# Patient Record
Sex: Female | Born: 1955
Health system: Southern US, Community
[De-identification: ages and names within clinical notes are randomized; demographics above are authoritative.]

## PROBLEM LIST (undated history)

## (undated) DIAGNOSIS — Q828 Other specified congenital malformations of skin: Secondary | ICD-10-CM

## (undated) DIAGNOSIS — Q66229 Congenital metatarsus adductus, unspecified foot: Secondary | ICD-10-CM

## (undated) DIAGNOSIS — M1990 Unspecified osteoarthritis, unspecified site: Secondary | ICD-10-CM

## (undated) DIAGNOSIS — M2010 Hallux valgus (acquired), unspecified foot: Secondary | ICD-10-CM

## (undated) DIAGNOSIS — F419 Anxiety disorder, unspecified: Secondary | ICD-10-CM

## (undated) DIAGNOSIS — R011 Cardiac murmur, unspecified: Secondary | ICD-10-CM

## (undated) DIAGNOSIS — I739 Peripheral vascular disease, unspecified: Secondary | ICD-10-CM

## (undated) DIAGNOSIS — E785 Hyperlipidemia, unspecified: Secondary | ICD-10-CM

## (undated) DIAGNOSIS — M19071 Primary osteoarthritis, right ankle and foot: Secondary | ICD-10-CM

## (undated) DIAGNOSIS — K219 Gastro-esophageal reflux disease without esophagitis: Secondary | ICD-10-CM

## (undated) DIAGNOSIS — M810 Age-related osteoporosis without current pathological fracture: Secondary | ICD-10-CM

## (undated) DIAGNOSIS — Z860100 Personal history of colon polyps, unspecified: Secondary | ICD-10-CM

## (undated) DIAGNOSIS — M2042 Other hammer toe(s) (acquired), left foot: Secondary | ICD-10-CM

## (undated) DIAGNOSIS — G250 Essential tremor: Secondary | ICD-10-CM

## (undated) DIAGNOSIS — F32A Depression, unspecified: Secondary | ICD-10-CM

## (undated) DIAGNOSIS — M2041 Other hammer toe(s) (acquired), right foot: Secondary | ICD-10-CM

## (undated) DIAGNOSIS — C801 Malignant (primary) neoplasm, unspecified: Secondary | ICD-10-CM

## (undated) DIAGNOSIS — M19079 Primary osteoarthritis, unspecified ankle and foot: Secondary | ICD-10-CM

## (undated) DIAGNOSIS — I10 Essential (primary) hypertension: Secondary | ICD-10-CM

## (undated) DIAGNOSIS — F329 Major depressive disorder, single episode, unspecified: Secondary | ICD-10-CM

## (undated) DIAGNOSIS — G4700 Insomnia, unspecified: Secondary | ICD-10-CM

## (undated) DIAGNOSIS — R251 Tremor, unspecified: Secondary | ICD-10-CM

## (undated) DIAGNOSIS — S5290XA Unspecified fracture of unspecified forearm, initial encounter for closed fracture: Secondary | ICD-10-CM

## (undated) HISTORY — DX: Tremor, unspecified: R25.1

## (undated) HISTORY — DX: Personal history of colon polyps, unspecified: Z86.0100

## (undated) HISTORY — DX: Hyperlipidemia, unspecified: E78.5

## (undated) HISTORY — DX: Malignant (primary) neoplasm, unspecified: C80.1

## (undated) HISTORY — DX: Depression, unspecified: F32.A

## (undated) HISTORY — DX: Essential tremor: G25.0

## (undated) HISTORY — DX: Peripheral vascular disease, unspecified: I73.9

## (undated) HISTORY — DX: Anxiety disorder, unspecified: F41.9

## (undated) HISTORY — DX: Congenital metatarsus adductus, unspecified foot: Q66.229

## (undated) HISTORY — DX: Primary osteoarthritis, unspecified ankle and foot: M19.079

## (undated) HISTORY — DX: Hallux valgus (acquired), unspecified foot: M20.10

## (undated) HISTORY — PX: BUNIONECTOMY: SHX129

## (undated) HISTORY — DX: Age-related osteoporosis without current pathological fracture: M81.0

## (undated) HISTORY — PX: BREAST CYST EXCISION: SHX579

## (undated) HISTORY — DX: Gastro-esophageal reflux disease without esophagitis: K21.9

## (undated) HISTORY — DX: Essential (primary) hypertension: I10

## (undated) HISTORY — DX: Other hammer toe(s) (acquired), right foot: M20.41

## (undated) HISTORY — DX: Primary osteoarthritis, right ankle and foot: M19.071

## (undated) HISTORY — DX: Other specified congenital malformations of skin: Q82.8

## (undated) HISTORY — DX: Major depressive disorder, single episode, unspecified: F32.9

## (undated) HISTORY — DX: Personal history of colonic polyps: Z86.010

## (undated) HISTORY — PX: BREAST SURGERY: SHX581

## (undated) HISTORY — DX: Other hammer toe(s) (acquired), left foot: M20.42

## (undated) HISTORY — PX: CARDIAC CATHETERIZATION: SHX172

---

## 1999-11-19 ENCOUNTER — Ambulatory Visit (HOSPITAL_COMMUNITY): Admission: RE | Admit: 1999-11-19 | Discharge: 1999-11-19 | Payer: Self-pay | Admitting: Gastroenterology

## 1999-12-01 ENCOUNTER — Other Ambulatory Visit: Admission: RE | Admit: 1999-12-01 | Discharge: 1999-12-01 | Payer: Self-pay | Admitting: *Deleted

## 2000-04-21 ENCOUNTER — Encounter: Payer: Self-pay | Admitting: Internal Medicine

## 2000-04-21 ENCOUNTER — Encounter: Admission: RE | Admit: 2000-04-21 | Discharge: 2000-04-21 | Payer: Self-pay | Admitting: Internal Medicine

## 2000-12-15 ENCOUNTER — Other Ambulatory Visit: Admission: RE | Admit: 2000-12-15 | Discharge: 2000-12-15 | Payer: Self-pay | Admitting: Internal Medicine

## 2001-01-09 ENCOUNTER — Other Ambulatory Visit: Admission: RE | Admit: 2001-01-09 | Discharge: 2001-01-09 | Payer: Self-pay | Admitting: *Deleted

## 2001-03-01 ENCOUNTER — Ambulatory Visit (HOSPITAL_BASED_OUTPATIENT_CLINIC_OR_DEPARTMENT_OTHER): Admission: RE | Admit: 2001-03-01 | Discharge: 2001-03-01 | Payer: Self-pay | Admitting: General Surgery

## 2001-03-01 ENCOUNTER — Encounter (INDEPENDENT_AMBULATORY_CARE_PROVIDER_SITE_OTHER): Payer: Self-pay | Admitting: *Deleted

## 2001-05-03 ENCOUNTER — Encounter: Payer: Self-pay | Admitting: Internal Medicine

## 2001-05-03 ENCOUNTER — Encounter: Admission: RE | Admit: 2001-05-03 | Discharge: 2001-05-03 | Payer: Self-pay | Admitting: Internal Medicine

## 2001-09-28 ENCOUNTER — Encounter: Admission: RE | Admit: 2001-09-28 | Discharge: 2001-09-28 | Payer: Self-pay | Admitting: *Deleted

## 2001-09-28 ENCOUNTER — Encounter: Payer: Self-pay | Admitting: *Deleted

## 2001-10-29 ENCOUNTER — Encounter: Payer: Self-pay | Admitting: Orthopedic Surgery

## 2001-10-29 ENCOUNTER — Ambulatory Visit (HOSPITAL_COMMUNITY): Admission: RE | Admit: 2001-10-29 | Discharge: 2001-10-29 | Payer: Self-pay | Admitting: Orthopedic Surgery

## 2002-01-02 ENCOUNTER — Other Ambulatory Visit: Admission: RE | Admit: 2002-01-02 | Discharge: 2002-01-02 | Payer: Self-pay | Admitting: Internal Medicine

## 2002-01-04 ENCOUNTER — Encounter: Payer: Self-pay | Admitting: Internal Medicine

## 2002-01-04 ENCOUNTER — Encounter: Admission: RE | Admit: 2002-01-04 | Discharge: 2002-01-04 | Payer: Self-pay | Admitting: Internal Medicine

## 2002-05-21 ENCOUNTER — Encounter: Payer: Self-pay | Admitting: Internal Medicine

## 2002-05-21 ENCOUNTER — Encounter: Admission: RE | Admit: 2002-05-21 | Discharge: 2002-05-21 | Payer: Self-pay | Admitting: Internal Medicine

## 2003-01-31 ENCOUNTER — Other Ambulatory Visit: Admission: RE | Admit: 2003-01-31 | Discharge: 2003-01-31 | Payer: Self-pay | Admitting: Internal Medicine

## 2003-10-09 ENCOUNTER — Encounter: Admission: RE | Admit: 2003-10-09 | Discharge: 2003-10-09 | Payer: Self-pay | Admitting: Internal Medicine

## 2003-10-09 ENCOUNTER — Encounter: Payer: Self-pay | Admitting: Internal Medicine

## 2003-10-09 ENCOUNTER — Ambulatory Visit (HOSPITAL_COMMUNITY): Admission: RE | Admit: 2003-10-09 | Discharge: 2003-10-09 | Payer: Self-pay | Admitting: Internal Medicine

## 2004-01-08 ENCOUNTER — Inpatient Hospital Stay (HOSPITAL_BASED_OUTPATIENT_CLINIC_OR_DEPARTMENT_OTHER): Admission: RE | Admit: 2004-01-08 | Discharge: 2004-01-08 | Payer: Self-pay | Admitting: Interventional Cardiology

## 2004-03-17 ENCOUNTER — Other Ambulatory Visit: Admission: RE | Admit: 2004-03-17 | Discharge: 2004-03-17 | Payer: Self-pay | Admitting: Internal Medicine

## 2004-05-07 ENCOUNTER — Encounter: Admission: RE | Admit: 2004-05-07 | Discharge: 2004-05-07 | Payer: Self-pay | Admitting: Internal Medicine

## 2004-07-13 ENCOUNTER — Encounter: Admission: RE | Admit: 2004-07-13 | Discharge: 2004-07-13 | Payer: Self-pay | Admitting: Internal Medicine

## 2005-06-03 ENCOUNTER — Encounter: Admission: RE | Admit: 2005-06-03 | Discharge: 2005-06-03 | Payer: Self-pay | Admitting: Internal Medicine

## 2005-06-13 ENCOUNTER — Encounter: Admission: RE | Admit: 2005-06-13 | Discharge: 2005-06-13 | Payer: Self-pay | Admitting: Internal Medicine

## 2005-12-19 HISTORY — PX: WRIST ARTHROPLASTY: SHX1088

## 2005-12-19 LAB — HM COLONOSCOPY

## 2006-03-28 ENCOUNTER — Other Ambulatory Visit: Admission: RE | Admit: 2006-03-28 | Discharge: 2006-03-28 | Payer: Self-pay | Admitting: Internal Medicine

## 2006-06-19 ENCOUNTER — Encounter: Admission: RE | Admit: 2006-06-19 | Discharge: 2006-06-19 | Payer: Self-pay | Admitting: Internal Medicine

## 2006-10-24 ENCOUNTER — Encounter: Admission: RE | Admit: 2006-10-24 | Discharge: 2006-10-24 | Payer: Self-pay | Admitting: Otolaryngology

## 2007-06-26 ENCOUNTER — Encounter: Admission: RE | Admit: 2007-06-26 | Discharge: 2007-06-26 | Payer: Self-pay | Admitting: *Deleted

## 2008-06-04 ENCOUNTER — Other Ambulatory Visit: Admission: RE | Admit: 2008-06-04 | Discharge: 2008-06-04 | Payer: Self-pay | Admitting: Family Medicine

## 2008-06-26 ENCOUNTER — Encounter: Admission: RE | Admit: 2008-06-26 | Discharge: 2008-06-26 | Payer: Self-pay | Admitting: Family Medicine

## 2009-06-18 ENCOUNTER — Other Ambulatory Visit: Admission: RE | Admit: 2009-06-18 | Discharge: 2009-06-18 | Payer: Self-pay | Admitting: Family Medicine

## 2009-07-08 ENCOUNTER — Encounter: Admission: RE | Admit: 2009-07-08 | Discharge: 2009-07-08 | Payer: Self-pay | Admitting: Family Medicine

## 2010-07-21 ENCOUNTER — Encounter: Admission: RE | Admit: 2010-07-21 | Discharge: 2010-07-21 | Payer: Self-pay | Admitting: Family Medicine

## 2010-08-25 ENCOUNTER — Encounter: Admission: RE | Admit: 2010-08-25 | Discharge: 2010-08-25 | Payer: Self-pay | Admitting: Family Medicine

## 2010-12-19 LAB — HM DEXA SCAN

## 2011-02-25 ENCOUNTER — Other Ambulatory Visit: Payer: Self-pay | Admitting: Family Medicine

## 2011-02-25 ENCOUNTER — Other Ambulatory Visit (HOSPITAL_COMMUNITY)
Admission: RE | Admit: 2011-02-25 | Discharge: 2011-02-25 | Disposition: A | Discharge status: Home / Self Care | Payer: 59 | Source: Ambulatory Visit | Attending: Family Medicine | Admitting: Family Medicine

## 2011-02-25 DIAGNOSIS — Z124 Encounter for screening for malignant neoplasm of cervix: Secondary | ICD-10-CM | POA: Insufficient documentation

## 2011-05-06 NOTE — Op Note (Signed)
[facility]  Patient:    Audrey Baxter, SCHAUER                   MRN: [id number] Proc. Date: 03/01/01 Adm. Date:  [id number] Attending:  Chevis Pretty S                           Operative Report  PREOPERATIVE DIAGNOSIS:  Infected cyst of back.  POSTOPERATIVE DIAGNOSIS:  Infected cyst of back.  PROCEDURE:  Incision and drainage of infected cyst on the back.  SURGEON:  Chevis Pretty, M.D.  ANESTHESIA:  Local with 1% lidocaine with epinephrine.  DESCRIPTION OF PROCEDURE:  After informed consent was obtained, the patient was brought to the operating room and placed in a prone position on the operating room table.  After prepping the area on the back with Betadine, this area was draped in the usual sterile manner.  The area around the infected cyst was infiltrated with 1% lidocaine with epinephrine, and several minutes were allowed to pass.  An elliptical incision was made over top of this cystic area, and the cyst was removed in its entirety using sharp dissection with a 15 blade knife.  It had appeared that the cyst had ruptured deeply into the muscle tissue, and a pocket was identified and opened with purulent material in it.  Because of this, the wound was left open and packed with gauze. Patient was instructed on dressing changes.  A sterile dressing was then applied on top of this.  The patient tolerated the procedure well.  At the end of the case, all needle, sponge, and instrument counts were correct.  The patient was in stable condition at the end of the case. DD:  03/01/01 TD:  03/01/01 Job: [id number]

## 2011-05-06 NOTE — Cardiovascular Report (Signed)
NAME:  LISEL, SIEGRIST                      ACCOUNT NO.:  [id number]   MEDICAL RECORD NO.:  [id number]                   PATIENT TYPE:  OIB   LOCATION:  6501                                 FACILITY:  [facility]   PHYSICIAN:  Lyn Records III, M.D.            DATE OF BIRTH:  09-04-1956   DATE OF PROCEDURE:  01/08/2004  DATE OF DISCHARGE:  01/08/2004                              CARDIAC CATHETERIZATION   INDICATIONS FOR PROCEDURE:  Abnormal Cardiolite, history of coronary artery  disease, symptoms of exertional dyspnea and other risk factors including  hyperlipidemia and smoking, but currently discontinued. The study is being  done to rule out obstructive coronary disease.   DATE OF PROCEDURE:  January 07, 2003.   PROCEDURE PERFORMED:  1. Left heart catheterization.  2. Selective coronary angiography.  3. Left ventriculography.   DESCRIPTION:  After informed consent, a 4-French sheath was placed in the  right femoral artery using modified Seldinger technique.  A 4-French Judkins  right catheter was used for hemodynamic recordings, left ventriculography by  hand injection, and right coronary angiography.  We then used a #4 4-French  left Judkins catheter for left coronary angiography.  The patient tolerated  the procedure without complications.   RESULTS:   I. HEMODYNAMIC DATA:  A.  Left ventricular pressure 115/10.  B.  Aortic pressure 111/52.   III. CORONARY ANGIOGRAPHY:  A.  Left main coronary:  Short and normal.  B.  Left anterior descending coronary:  The LAD is large.  There does appear  to be a region where the left anterior descending dips into the septum.  This is just distal to the first septal perforator.  Also, in the LAD in  this region there appears to be a 25-30% narrowing.  No high grade  obstruction in the LAD is noted.  The LAD wraps around the left ventricular  apex.  C.  Circumflex artery:  The circumflex coronary artery gives origin to one  large  trifurcating obtuse marginal and the continuation to a smaller second  obtuse marginal.  No significant obstruction is noted in the circumflex.  D.  Right coronary:  The right coronary artery is dominant given the PDA, AV  nodal artery and large RV branch.  No significant abnormalities are noted.   IV. LEFT VENTRICULOGRAPHY:  The left ventricle demonstrates normal overall  contractility with an ejection fraction estimated to be  55%.   CONCLUSIONS:  1. Widely patent coronaries with perhaps a 25% narrowing in what appears to     be a intramyocardial portion of the left anterior descending just distal     to the first septal perforator.  No significant obstructive coronary     lesions are noted.  2. Overall normal left ventricular function and left ventricular pressures.  3. Abnormal Cardiolite, likely represents false-positive response due to     soft tissue attenuation.   RECOMMENDATIONS:  Continue aggressive risk factor modification.  Lesleigh Noe, M.D.    HWS/MEDQ  D:  01/08/2004  T:  01/08/2004  Job:  [id number]   cc:   Darius Bump, M.D.  [place]  [place] [address]  Fax: [phone]

## 2011-07-26 ENCOUNTER — Other Ambulatory Visit: Payer: Self-pay | Admitting: Family Medicine

## 2011-07-26 DIAGNOSIS — Z1231 Encounter for screening mammogram for malignant neoplasm of breast: Secondary | ICD-10-CM

## 2011-09-16 ENCOUNTER — Ambulatory Visit
Admission: RE | Admit: 2011-09-16 | Discharge: 2011-09-16 | Disposition: A | Discharge status: Home / Self Care | Payer: 59 | Source: Ambulatory Visit | Attending: Family Medicine | Admitting: Family Medicine

## 2011-09-16 DIAGNOSIS — Z1231 Encounter for screening mammogram for malignant neoplasm of breast: Secondary | ICD-10-CM

## 2011-11-16 ENCOUNTER — Ambulatory Visit (INDEPENDENT_AMBULATORY_CARE_PROVIDER_SITE_OTHER): Payer: 59 | Admitting: Sports Medicine

## 2011-11-16 VITALS — BP 104/60 | Ht 64.0 in | Wt 160.0 lb

## 2011-11-16 DIAGNOSIS — M25551 Pain in right hip: Secondary | ICD-10-CM

## 2011-11-16 DIAGNOSIS — M25559 Pain in unspecified hip: Secondary | ICD-10-CM

## 2011-11-16 DIAGNOSIS — M2170 Unequal limb length (acquired), unspecified site: Secondary | ICD-10-CM | POA: Insufficient documentation

## 2011-11-16 HISTORY — DX: Unequal limb length (acquired), unspecified site: M21.70

## 2011-11-16 HISTORY — DX: Pain in right hip: M25.551

## 2011-11-16 NOTE — Patient Instructions (Signed)
Use heel lifts in shoes as much as possible  Please do suggested hip exercises daily  Ok to take ibuprofen for pain  Please follow up in 4 weeks  Thank you for seeing [place] today!

## 2011-11-16 NOTE — Assessment & Plan Note (Signed)
She is given exercises to work on both the strength of the right hip which is weaker on abduction and the mobility of the hip joint and the SI joint. She should do these daily  Recheck this in 6 weeks and consider an AP of the pelvis if she's not responding well

## 2011-11-16 NOTE — Progress Notes (Signed)
  Subjective:    Patient ID: Audrey Baxter, female    DOB: Mar 01, 1956, 55 y.o.   MRN: [id number]  HPI  Pt presents to clinic for evaluation of bilateral anterior hip pain R>L x 4 months. Walks 25 miles per week, and only has pain with walking. Pain presents on extension and external rotation.  Hx of osteopenia  She is currently a Gaffer and debility at [employer] Review of Systems     Objective:   Physical Exam  Standing posture- Lt shoulder lower, trunk rotated 10 deg to lt Pelvis not level  Lt hip lower and anteriorly rotated   Walking gait- trendelenburg to left Shifts to left  Slight curve in mid thoracic spine  IR of rt hip 40 deg, ER 20 deg ER of lt hip 40 deg, IR 30 deg  Lt leg 85 cm Rt leg 83 cm   SI joints move freely FABER tight on rt, and much less flexible on lt Rt hip abduction weak Lt hip abduction strong Adduction strong on rt Hip flexion strong bilat Pretzel stretch on rt SI tight, but normal  Lying position: 45 deg ER, 30 deg IR on rt leg 70 deg ER, 30 deg IR on lt leg       Assessment & Plan:

## 2011-11-16 NOTE — Assessment & Plan Note (Signed)
She is given some three-quarter length medial wedges that provide correction for about 1 cm of the leg length difference. When these are in place in her shoes she walks with less Trendelenburg and with less tilt of her upper body. This felt more comfortable to her with walking.

## 2011-12-21 ENCOUNTER — Ambulatory Visit (INDEPENDENT_AMBULATORY_CARE_PROVIDER_SITE_OTHER): Payer: 59 | Admitting: Sports Medicine

## 2011-12-21 ENCOUNTER — Encounter: Payer: Self-pay | Admitting: Sports Medicine

## 2011-12-21 VITALS — BP 140/82 | HR 67

## 2011-12-21 DIAGNOSIS — M25559 Pain in unspecified hip: Secondary | ICD-10-CM

## 2011-12-21 DIAGNOSIS — M2170 Unequal limb length (acquired), unspecified site: Secondary | ICD-10-CM

## 2011-12-21 DIAGNOSIS — M25551 Pain in right hip: Secondary | ICD-10-CM

## 2011-12-21 NOTE — Patient Instructions (Addendum)
Please continue hip exercises on both sides.   Low back exercises. Continue Heel lifts. Continue aleve 2x a day. Come back to see [place] in 4-6 weeks. Drs. Fields and [employer]

## 2011-12-21 NOTE — Progress Notes (Signed)
  Subjective:    Patient ID: Audrey Baxter, female    DOB: 30-Mar-1956, 56 y.o.   MRN: [id number]  HPI This patient comes back for followup of her bilateral hip pain. She's had this for approximately 6-9 months, she saw [place] a month ago, had a leg length discrepancy that was corrected. Comes back noting that her pain is approximately 50% better on the right side and approximately 30% better on the left. She notes that it continues to get better and has not plateaued. She localizes her pain to the anterolateral groin. Pain is worsened with resisted hip flexion as well as abduction. She has been doing some of the exercises, however has not been doing the side lying hip abductors. She is on a statin medication, however starting the statin had no temporal relation to her hip pain.   Review of Systems    no fevers, chills, night sweats, weight loss, headache, visual changes. Objective:   Physical Exam General:  Well developed, well nourished, and in no acute distress. Neuro:  Alert and oriented x3, extra-ocular muscles intact. Skin: Warm and dry, no rashes noted.  Hip: Inernal rotation and exernal rotation to 45degrees bilaterally. Strength: Good with the exception of hip abduction on the right side, which is mildly weak. Hip flexion is good bilaterally, some minimal pain with resisted hip flexion on the right side. No tenderness to palpation over the trochanteric bursa.     Assessment & Plan:   1. Bilateral hip pain: Most likely due to biomechanical issues regarding leg length discrepancy. This was corrected. I think at this point we now need to correct her weakness. She may continue her Aleve twice a day. I will see her back in 4-6 weeks. If the pain persists, we can consider an x-ray of her pelvis, as well as an ESR to assess polymyalgia rheumatica.

## 2011-12-21 NOTE — Assessment & Plan Note (Signed)
Improving with exercises and leg length correction  C. the current plan

## 2011-12-21 NOTE — Assessment & Plan Note (Signed)
Continue to use a felt lift to help correct leg length difference in any shoes and when she is standing or walking substantially

## 2011-12-22 ENCOUNTER — Other Ambulatory Visit: Payer: Self-pay | Admitting: Dermatology

## 2012-01-25 ENCOUNTER — Ambulatory Visit: Payer: 59 | Admitting: Sports Medicine

## 2012-02-01 ENCOUNTER — Ambulatory Visit (INDEPENDENT_AMBULATORY_CARE_PROVIDER_SITE_OTHER): Payer: 59 | Admitting: Sports Medicine

## 2012-02-01 ENCOUNTER — Other Ambulatory Visit: Payer: 59

## 2012-02-01 VITALS — BP 100/70

## 2012-02-01 DIAGNOSIS — M25551 Pain in right hip: Secondary | ICD-10-CM

## 2012-02-01 DIAGNOSIS — M25559 Pain in unspecified hip: Secondary | ICD-10-CM

## 2012-02-01 LAB — HIGH SENSITIVITY CRP: CRP, High Sensitivity: 0.7 mg/L

## 2012-02-01 LAB — CK: Total CK: 45 U/L (ref 7–177)

## 2012-02-01 NOTE — Progress Notes (Signed)
  Subjective:    Patient ID: Audrey Baxter, female    DOB: Aug 29, 1956, 56 y.o.   MRN: [id number]  HPI Betta comes back to see [place] for bilateral hip pain. This is been present now for over 10 months. We've corrected a leg length discrepancy, we have also been working on her hip abductor weakness. She's been using Aleve twice a day. Overall she significantly improved, but still has some pain. She also notes moderate stiffness and pain is really the worst in the morning. Never had x-rays of her hips, and has never had a CRP, or ESR checked.   Review of Systems    No fevers, chills, night sweats, weight loss, chest pain, or shortness of breath.  Social History: Non-smoker. Objective:   Physical Exam General:  Well developed, well nourished, and in no acute distress. Neuro:  Alert and oriented x3, extra-ocular muscles intact. Skin: Warm and dry, no rashes noted. Respiratory:  Not using accessory muscles, speaking in full sentences. Musculoskeletal: Bilateral Hip: ROM IR: 45 Deg, ER: 45 Deg, Flexion: 120 Deg, Extension: 100 Deg, Abduction: 45 Deg, Adduction: 45 Deg Strength IR: 5/5, ER: 5/5, Flexion: 5/5, Extension: 5/5, Abduction: 5/5, Adduction: 5/5 Pelvic alignment unremarkable to inspection and palpation. Standing hip rotation and gait without trendelenburg sign / unsteadiness. Greater trochanter without tenderness to palpation. No tenderness over piriformis and greater trochanter. No pain with FABER or FADIR. No SI joint tenderness and normal minimal SI movement. Hip abductor strength excellent. No pain with resisted hip flexion, or abduction.    Assessment & Plan:

## 2012-02-01 NOTE — Assessment & Plan Note (Addendum)
Strength is now improved. Length discrepancy/biomechanical deficits have been corrected. Pain still present, and since worse in the morning, concern for polymyalgia rheumatica. We'll check an ESR, CPK, and a CRP. We'll also check x-rays of her hips. Like her to continue her exercises, and continue the anti-inflammatories. Would like to see her back in 4 weeks.

## 2012-02-01 NOTE — Progress Notes (Signed)
CRP,CK AND ESR DONE TODAY Proctor Carriker

## 2012-02-02 LAB — SEDIMENTATION RATE: Sed Rate: 5 mm/hr (ref 0–22)

## 2012-02-29 ENCOUNTER — Ambulatory Visit: Payer: 59 | Admitting: Sports Medicine

## 2012-03-02 ENCOUNTER — Ambulatory Visit
Admission: RE | Admit: 2012-03-02 | Discharge: 2012-03-02 | Disposition: A | Discharge status: Home / Self Care | Payer: 59 | Source: Ambulatory Visit | Attending: Sports Medicine | Admitting: Sports Medicine

## 2012-03-02 DIAGNOSIS — M25551 Pain in right hip: Secondary | ICD-10-CM

## 2012-03-02 DIAGNOSIS — M25552 Pain in left hip: Secondary | ICD-10-CM

## 2012-03-07 ENCOUNTER — Ambulatory Visit (INDEPENDENT_AMBULATORY_CARE_PROVIDER_SITE_OTHER): Payer: 59 | Admitting: Sports Medicine

## 2012-03-07 VITALS — BP 120/70

## 2012-03-07 DIAGNOSIS — M2170 Unequal limb length (acquired), unspecified site: Secondary | ICD-10-CM

## 2012-03-07 DIAGNOSIS — M25552 Pain in left hip: Secondary | ICD-10-CM

## 2012-03-07 DIAGNOSIS — M25559 Pain in unspecified hip: Secondary | ICD-10-CM

## 2012-03-07 DIAGNOSIS — M25551 Pain in right hip: Secondary | ICD-10-CM

## 2012-03-07 NOTE — Progress Notes (Signed)
  Subjective:    Patient ID: Audrey Baxter, female    DOB: 1956-05-12, 56 y.o.   MRN: [id number]  HPI  Patient returns to clinic for follow up bilateral hip pain.  Has been going on for over one year.  Found to have a leg length discrepancy which was corrected with heel inserts.  She also had labs (ESR, CK, CRP) which were negative and hip X-ray which was also negative for DJD.    Patient says bilateral hip pain is improving.  Heel inserts have decreased R hip pain significantly.  However, she still complains of hip pain after 1.5 mile walks.  Described as dull and achy.  Patient has stopped hip and low back exercises 2 weeks ago due to time constraints.  She takes an occasional Advil as needed for pain.   Review of Systems  Endorses bilateral hip pain with exertion.  Denies fever, chills, NS, pain in multiple joints.    Objective:   Physical Exam  Hip, right: ROM IR: 45 Deg, ER: 45 Deg, Flexion: 100 Deg, Extension: 100 Deg, Abduction: 45 Deg, Adduction: 45 Deg Strength IR: 5/5, ER: 5/5, Flexion: 5/5, Extension: 5/5, Abduction: 5/5, Adduction: 5/5 Pelvic alignment unremarkable to inspection and palpation. No tenderness over piriformis and greater trochanter. No pain with FABER or FADIR. No SI joint tenderness and normal minimal SI movement. Hip abductor strength excellent. No pain with resisted hip flexion, or abduction.  Hip, left: ROM IR: 50 Deg, ER: 45 Deg, Flexion: 120 Deg, Extension: 100 Deg, Abduction: 45 Deg, Adduction: 45 Deg Strength IR: 5/5, ER: 5/5, Flexion: 5/5, Extension: 5/5, Abduction: 5/5, Adduction: 5/5 Pelvic alignment unremarkable to inspection and palpation. No tenderness over piriformis and greater trochanter. No pain with FABER or FADIR. Hip abductor strength excellent. No pain with resisted hip flexion, or abduction.   AP of the pelvis does show a slight increase in acetabular spur on the right and a smaller one on the left. There is no degenerative  joint disease of the femoral heads  Walking gait with felt lift for her right foot is now neutral with no Trendelenburg      Assessment & Plan:

## 2012-03-07 NOTE — Assessment & Plan Note (Signed)
Continue using heel lift on right side.

## 2012-03-07 NOTE — Assessment & Plan Note (Signed)
Right hip pain likely secondary to bone spurring on right pelvis per Xray. ESR, CK, CRP were all within normal limits. Pain in bilateral hip improved 85% in the last year. Continue to do strength exercises three times per week per handout. Carry backpack on both shoulders to avoid leaning to right hip. Wear heel lifts in all shoes. Follow up in 4 months if needed or sooner if symptoms worsen.

## 2012-03-07 NOTE — Patient Instructions (Signed)
Hip Xray was negative for severe arthritis, but did show bone spur on right. Labs were negative. Resume hip and low back strength exercises three times per week. Continue to wear heel insert in all shoes. Wear backpack on both shoulders to avoid leaning to right hip. Follow up in 4 months if needed.

## 2013-03-29 ENCOUNTER — Other Ambulatory Visit (HOSPITAL_COMMUNITY)
Admission: RE | Admit: 2013-03-29 | Discharge: 2013-03-29 | Disposition: A | Discharge status: Home / Self Care | Payer: 59 | Source: Ambulatory Visit | Attending: Family Medicine | Admitting: Family Medicine

## 2013-03-29 ENCOUNTER — Other Ambulatory Visit: Payer: Self-pay | Admitting: Family Medicine

## 2013-03-29 DIAGNOSIS — Z124 Encounter for screening for malignant neoplasm of cervix: Secondary | ICD-10-CM | POA: Insufficient documentation

## 2013-03-29 DIAGNOSIS — Z1151 Encounter for screening for human papillomavirus (HPV): Secondary | ICD-10-CM | POA: Insufficient documentation

## 2013-04-25 ENCOUNTER — Other Ambulatory Visit: Payer: Self-pay | Admitting: Nurse Practitioner

## 2013-08-06 ENCOUNTER — Ambulatory Visit (INDEPENDENT_AMBULATORY_CARE_PROVIDER_SITE_OTHER): Payer: 59 | Admitting: Sports Medicine

## 2013-08-06 VITALS — BP 134/87 | Ht 63.5 in | Wt 155.0 lb

## 2013-08-06 DIAGNOSIS — M25561 Pain in right knee: Secondary | ICD-10-CM

## 2013-08-06 DIAGNOSIS — M25569 Pain in unspecified knee: Secondary | ICD-10-CM

## 2013-08-06 DIAGNOSIS — M2170 Unequal limb length (acquired), unspecified site: Secondary | ICD-10-CM

## 2013-08-06 HISTORY — DX: Pain in right knee: M25.561

## 2013-08-06 NOTE — Assessment & Plan Note (Addendum)
She is to continue using her slight heel wedge although I think this is a functional discrepancy.    After hip strengthening exercises completed if she continues to have issues with knee instability, formal foot and gait analysis will be needed.

## 2013-08-06 NOTE — Assessment & Plan Note (Signed)
Evidence of a bakers cyst on exam Wear compression sleeve daily and for at least 1 hour after exercise Use ice daily after your exercise, okay to use Ibuprofen or Aleve if needed Exercises at least once daily, if you can do them 3 times that would be ideal Start sets of 15 working up to 30 for each.  Follow up in 4 weeks for ultrasound

## 2013-08-06 NOTE — Progress Notes (Signed)
[facility]  Patient name: Audrey Baxter MRN [id number]  Date of birth: 1956/10/04  CC & HPI:  Audrey Baxter is a 57 y.o. female who is returning to care.   Chief Complaint  Patient presents with  . Knee Pain Patient reports that approximately 2 weeks ago while playing tennis her right knee gave way on her while attempting to hit an aggressive forehand.  She started playing tennis again 6 weeks ago after a prolonged time away from the sport.  She is active in yoga as well..  She does not remember any specific injury or having pain; reports her knee spontaneously gave way on her.  She did not report a pop .  She did have swelling in her knee and in her right ankle following this incident.  She had a recurrence of her knee giving way approximately 5 days ago that caused her to fall again.  She denies any other trauma this time.  She is not having any pain at this time but is concerned because of weakness she feels while standing straight legged.  She is not having to take any medications at this time but did take ibuprofen earlier.  She has been wearing a body helix compression sleeve and alternating with a patellar control brace since her second fall.  She has been using ice.  She denies any significant swelling at this time, no erythema.  She does report a history of length inequality that she uses a small HAPAD heel wedge in her right shoe.     ROS:  See HPI  Pertinent History Reviewed:  Medical & Surgical Hx:  Reviewed: Significant for leg length inequality, bilateral hip pain, Medications: Reviewed & Updated - see associated section Social History: Reviewed -  reports that she quit smoking about 34 years ago. She has never used smokeless tobacco.   Objective Findings:  Vitals: BP 134/87  Ht 5' 3.5" (1.613 m)  Wt 155 lb (70.308 kg)  BMI 27.02 kg/m2  PE: GENERAL:  adult Caucasian female. In no discomfort; no respiratory distress  PSYCH:  alert and appropriate, good  insight      NeuroMSK  right lower extremity Exam: Appear:  knee is normal appearing, there is no erythema or ecchymosis   Palp:  she has no tenderness palpation over the medial or lateral joint line.  There is no appreciable Baker's cyst.  She does have a small amount of swelling on the medial aspect of the patella with a palpable snapping in this area.  This is not painful   ROM:  she has full range of motion of her hip and her knee   NV:   lower extremity myotome testing 5+ out of 5 bilaterally.  Sensation is grossly intact.  Bilateral upper and lower extremity DTRs 3+ out of 4 diffusely.  Extremities are warm and well perfused   Testing:  right knee negative Lachman's, negative anterior posterior drawer .  Negative McMurray's .  Negative FABER, FADIR, log roll.  Hip abduction testing 4+ out of 5 on the right, 5+ out of 5 on the left.  She has a functional right leg length inequality of approximately 0.5 cm that corrects with sitting.       MSK [place]   right knee reveals a hypoechoic change in the popliteal fossa that is approximately 0.6 cm consistent with a Baker's cyst.  There is a trace effusion without evidence of bursitis.  There is a potentially thickened medial plica.  Medial meniscus appears to have some degenerative changes but there is congruence of the medial collateral ligament without bulging   Assessment & Plan:   1. Right knee pain    See problem associated charting

## 2013-08-06 NOTE — Patient Instructions (Signed)
It was nice to see you today, thanks for coming in!  Problem List Items Addressed This Visit   Right knee pain - Primary     Evidence of a bakers cyst on exam Wear compression sleeve daily and for at least 1 hour after exercise Use ice daily after your exercise, okay to use Ibuprofen or Aleve if needed Exercises at least once daily, if you can do them 3 times that would be ideal Start sets of 15 working up to 30 for each.  Follow up in 4 weeks for ultrasound        Please plan to return to see Dr. Margaretha Sheffield in 4 weeks.  If you need anything prior to your next visit please call the clinic.  Please Bring all medications or accurate medication list with you to each appointment; an accurate medication list is essential in providing you the best care possible.

## 2013-09-09 ENCOUNTER — Encounter: Payer: Self-pay | Admitting: Sports Medicine

## 2013-09-09 ENCOUNTER — Ambulatory Visit (INDEPENDENT_AMBULATORY_CARE_PROVIDER_SITE_OTHER): Payer: 59 | Admitting: Sports Medicine

## 2013-09-09 VITALS — BP 137/91 | HR 71 | Ht 63.5 in | Wt 155.0 lb

## 2013-09-09 DIAGNOSIS — M25561 Pain in right knee: Secondary | ICD-10-CM

## 2013-09-09 DIAGNOSIS — M25569 Pain in unspecified knee: Secondary | ICD-10-CM

## 2013-09-09 NOTE — Progress Notes (Signed)
  Subjective:    Patient ID: Audrey Baxter, female    DOB: [birth year], 57 y.o.   MRN: [id number]  HPI   57 yo woman presents for 1 mo f/u regarding R knee instability during running.  Since last visit, pt has been doing home exercise regimen x 3 weeks with the occasional use of ibuprofen. pt reports that her knee has improved 80%. Patient has not had any more episodes of instability. Knee swelling and ankle swelling as resolved.   Pt reports having occasional burning pain with deep squats in the lateral posterior aspect of her knee. This pain does not prevent her from doing the activities that she regularly enjoys. Pain is 5/10 at its worst during deep bending, and 0-1/10 at rest.     Review of Systems     Objective:   Physical Exam General: Well appearing woman in no acute distress Psych: normal insight, mood appropriate NeuroMSK right lower extremity Exam:  Appearance: knee is normal appearing, there is no erythema or ecchymosis   Palpation: she has no tenderness palpation over the medial or lateral joint line. Medial and lateral patellar facets are not tender to palpation. There is no appreciable Baker's cyst. No knee effusion is present.  Palpation: she has no tenderness palpation over the medial or lateral joint line. There is no appreciable Baker's cyst. No knee effusion is present  ROM: she has full range of motion of her hip and her knee   NV: lower extremity myotome testing 5+ out of 5 bilaterally. Sensation is grossly intact. Bilateral upper and lower extremity DTRs 3+ out of 4 diffusely. Extremities are warm and well perfused. No pedal edema present.  Testing: right knee negative Lachman's, negative anterior posterior drawer, negative McMurray's, varus/valgus stress wnl.       Assessment & Plan:  R knee pain: improved. Previously ruptured bakers cyst vs mild chronic meniscal tear.  - advance home exercise regimen to include 45 deg angle squats - advance to  walk/run - continue use of body helix knee brace during tennis - nsaids prn - return to clinic prn  Pt seen and discussed with Dr. Lennart Pall, PGY-3

## 2013-12-24 DIAGNOSIS — Z86006 Personal history of melanoma in-situ: Secondary | ICD-10-CM | POA: Insufficient documentation

## 2014-05-29 LAB — HM PAP SMEAR

## 2015-04-19 DIAGNOSIS — H905 Unspecified sensorineural hearing loss: Secondary | ICD-10-CM | POA: Insufficient documentation

## 2015-05-25 ENCOUNTER — Ambulatory Visit: Payer: Self-pay | Admitting: Podiatry

## 2015-06-29 ENCOUNTER — Ambulatory Visit: Payer: Self-pay | Admitting: Podiatry

## 2015-07-07 LAB — LIPID PANEL
CHOLESTEROL: 147 mg/dL (ref 0–200)
HDL: 74 mg/dL — AB (ref 35–70)
LDL Cholesterol: 57 mg/dL
LDL/HDL RATIO: 2
TRIGLYCERIDES: 86 mg/dL (ref 40–160)

## 2015-07-07 LAB — HEPATIC FUNCTION PANEL
Alkaline Phosphatase: 70 U/L (ref 25–125)
Bilirubin, Total: 0.3 mg/dL

## 2015-07-07 LAB — BASIC METABOLIC PANEL
BUN: 7 mg/dL (ref 4–21)
Creatinine: 0.6 mg/dL (ref 0.5–1.1)
Potassium: 3.6 mmol/L (ref 3.4–5.3)
SODIUM: 127 mmol/L — AB (ref 137–147)

## 2015-07-07 LAB — CBC AND DIFFERENTIAL
NEUTROS ABS: 5 /uL
WBC: 8.5 10*3/mL

## 2015-07-20 ENCOUNTER — Ambulatory Visit: Payer: Self-pay | Admitting: Podiatry

## 2015-07-21 ENCOUNTER — Ambulatory Visit: Payer: Self-pay | Admitting: Podiatry

## 2015-07-23 ENCOUNTER — Ambulatory Visit (INDEPENDENT_AMBULATORY_CARE_PROVIDER_SITE_OTHER): Payer: 59 | Admitting: Podiatry

## 2015-07-23 ENCOUNTER — Ambulatory Visit: Payer: 59

## 2015-07-23 ENCOUNTER — Ambulatory Visit (INDEPENDENT_AMBULATORY_CARE_PROVIDER_SITE_OTHER): Payer: 59

## 2015-07-23 ENCOUNTER — Encounter: Payer: Self-pay | Admitting: Podiatry

## 2015-07-23 VITALS — BP 94/67 | HR 70 | Resp 15 | Ht 63.0 in | Wt 135.0 lb

## 2015-07-23 DIAGNOSIS — Z0189 Encounter for other specified special examinations: Secondary | ICD-10-CM | POA: Diagnosis not present

## 2015-07-23 DIAGNOSIS — M2012 Hallux valgus (acquired), left foot: Secondary | ICD-10-CM

## 2015-07-23 DIAGNOSIS — M21612 Bunion of left foot: Secondary | ICD-10-CM

## 2015-07-23 NOTE — Progress Notes (Signed)
   Subjective:    Patient ID: Audrey Baxter, female    DOB: 04-23-1956, 59 y.o.   MRN: [id number]  HPI Patient presents with a bunion on their right foot. Pt wants to discuss getting surgery done on their foot. Pt stated, "starts to ache and gets pain when wearing shoes." This has been going on for the past year.   Pt did have previous bunion surgery done 5 years ago on their left foot.   Review of Systems  HENT: Positive for hearing loss.   Neurological: Positive for tremors.  All other systems reviewed and are negative.      Objective:   Physical Exam        Assessment & Plan:

## 2015-07-23 NOTE — Patient Instructions (Signed)
Pre-Operative Instructions  Congratulations, you have decided to take an important step to improving your quality of life.  You can be assured that the doctors of [facility] will be with you every step of the way.  1. Plan to be at the surgery center/hospital at least 1 (one) hour prior to your scheduled time unless otherwise directed by the surgical center/hospital staff.  You must have a responsible adult accompany you, remain during the surgery and drive you home.  Make sure you have directions to the surgical center/hospital and know how to get there on time. 2. For hospital based surgery you will need to obtain a history and physical form from your family physician within 1 month prior to the date of surgery- we will give you a form for you primary physician.  3. We make every effort to accommodate the date you request for surgery.  There are however, times where surgery dates or times have to be moved.  We will contact you as soon as possible if a change in schedule is required.   4. No Aspirin/Ibuprofen for one week before surgery.  If you are on aspirin, any non-steroidal anti-inflammatory medications (Mobic, Aleve, Ibuprofen) you should stop taking it 7 days prior to your surgery.  You make take Tylenol  For pain prior to surgery.  5. Medications- If you are taking daily heart and blood pressure medications, seizure, reflux, allergy, asthma, anxiety, pain or diabetes medications, make sure the surgery center/hospital is aware before the day of surgery so they may notify you which medications to take or avoid the day of surgery. 6. No food or drink after midnight the night before surgery unless directed otherwise by surgical center/hospital staff. 7. No alcoholic beverages 24 hours prior to surgery.  No smoking 24 hours prior to or 24 hours after surgery. 8. Wear loose pants or shorts- loose enough to fit over bandages, boots, and casts. 9. No slip on shoes, sneakers are best. 10. Bring  your boot with you to the surgery center/hospital.  Also bring crutches or a walker if your physician has prescribed it for you.  If you do not have this equipment, it will be provided for you after surgery. 11. If you have not been contracted by the surgery center/hospital by the day before your surgery, call to confirm the date and time of your surgery. 12. Leave-time from work may vary depending on the type of surgery you have.  Appropriate arrangements should be made prior to surgery with your employer. 13. Prescriptions will be provided immediately following surgery by your doctor.  Have these filled as soon as possible after surgery and take the medication as directed. 14. Remove nail polish on the operative foot. 15. Wash the night before surgery.  The night before surgery wash the foot and leg well with the antibacterial soap provided and water paying special attention to beneath the toenails and in between the toes.  Rinse thoroughly with water and dry well with a towel.  Perform this wash unless told not to do so by your physician.  Enclosed: 1 Ice pack (please put in freezer the night before surgery)   1 Hibiclens skin cleaner   Pre-op Instructions  If you have any questions regarding the instructions, do not hesitate to call our office.  [place]: [address] [place], [place] [address] [phone]  [place]: [address] [place], [place] [address] [phone]  [place]: [address]  [place], [place] [address] [phone]  Dr. Richard   Tuchman DPM, Dr. Aureliano Oshields DPM Dr. Richard Sikora DPM, Dr. M. Todd Hyatt DPM, Dr. Kathryn Egerton DPM 

## 2015-07-23 NOTE — Progress Notes (Signed)
Subjective:     Patient ID: Audrey Baxter, female   DOB: Dec 25, 1955, 59 y.o.   MRN: [id number]  HPI patient presents stating my bunion has been so painful on my right foot that I need to get it fixed and I have a one fixed on my left 5 years ago and it's doing really well   Review of Systems  All other systems reviewed and are negative.      Objective:   Physical Exam  Constitutional: She is oriented to person, place, and time.  Cardiovascular: Intact distal pulses.   Musculoskeletal: Normal range of motion.  Neurological: She is oriented to person, place, and time.  Skin: Skin is warm.  Nursing note and vitals reviewed.  neurovascular status is found to be intact with muscle strength adequate range of motion within normal limits patient's noted to have good digital perfusion is well oriented 3 and is found to have large structural bunion with redness and pain first metatarsal right with elevation of the intermetatarsal angle of approximately 15. She has tried wider shoes she's tried soaks and padding without relief and had excellent correction of her left foot     Assessment:     Structural HAV deformity right with correction left which is doing well    Plan:     H&P and x-rays reviewed with patient. Due to long-standing nature pain and doing so well with the left she wants to get it corrected and due to her schedule needs to get it done as soon as possible. At this time since she is familiar with these types of procedures I allowed her to read consent form for correction and explained to her the nature of the procedure and all alternative treatments and complications. Patient wants surgery and signs consent form understanding risk and understands total recovery. Can take 6 months to one year. She is scheduled for outpatient surgery and is given all preoperative instructions and is encouraged to call with questions. She is dispensed air fracture walker at this time with  instructions on usage

## 2015-07-27 ENCOUNTER — Other Ambulatory Visit (HOSPITAL_COMMUNITY): Payer: Self-pay | Admitting: Otolaryngology

## 2015-07-27 ENCOUNTER — Telehealth: Payer: Self-pay | Admitting: *Deleted

## 2015-07-27 DIAGNOSIS — H9193 Unspecified hearing loss, bilateral: Secondary | ICD-10-CM

## 2015-07-27 NOTE — Telephone Encounter (Signed)
"  I saw Dr. Paulla Dolly on Thursday and he squeezed me in for surgery on tomorrow.  I spoke to my husband and I have decided I don't want to have it done.  I called on Friday and said I wanted to cancel.  I've received 3 phone calls from the surgery center.  It hasn't been taken off the books!"  I'm sorry, I'll take care of that.  Would you like to reschedule?  "No I don't."

## 2015-08-03 ENCOUNTER — Other Ambulatory Visit: Payer: Self-pay

## 2015-08-14 ENCOUNTER — Ambulatory Visit (HOSPITAL_COMMUNITY)
Admission: RE | Admit: 2015-08-14 | Discharge: 2015-08-14 | Disposition: A | Discharge status: Home / Self Care | Payer: 59 | Source: Ambulatory Visit | Attending: Otolaryngology | Admitting: Otolaryngology

## 2015-12-28 MED FILL — PRISTIQ ER 100 MG TABLET: 100 | 30 days supply | Qty: 30 | Fill #4 | Status: TO

## 2015-12-28 MED FILL — BUPROPION HCL XL 300 MG TAB: 300 | 30 days supply | Qty: 30 | Fill #4 | Status: TO

## 2015-12-30 MED FILL — PRIMIDONE 50 MG TABLET: 50 | 30 days supply | Qty: 150 | Fill #0

## 2016-01-11 ENCOUNTER — Encounter: Payer: Self-pay | Admitting: Podiatry

## 2016-01-11 ENCOUNTER — Ambulatory Visit (INDEPENDENT_AMBULATORY_CARE_PROVIDER_SITE_OTHER): Payer: 59 | Admitting: Podiatry

## 2016-01-11 DIAGNOSIS — M21619 Bunion of unspecified foot: Secondary | ICD-10-CM

## 2016-01-11 NOTE — Patient Instructions (Signed)
Pre-Operative Instructions  Congratulations, you have decided to take an important step to improving your quality of life.  You can be assured that the doctors of [facility] will be with you every step of the way.  1. Plan to be at the surgery center/hospital at least 1 (one) hour prior to your scheduled time unless otherwise directed by the surgical center/hospital staff.  You must have a responsible adult accompany you, remain during the surgery and drive you home.  Make sure you have directions to the surgical center/hospital and know how to get there on time. 2. For hospital based surgery you will need to obtain a history and physical form from your family physician within 1 month prior to the date of surgery- we will give you a form for you primary physician.  3. We make every effort to accommodate the date you request for surgery.  There are however, times where surgery dates or times have to be moved.  We will contact you as soon as possible if a change in schedule is required.   4. No Aspirin/Ibuprofen for one week before surgery.  If you are on aspirin, any non-steroidal anti-inflammatory medications (Mobic, Aleve, Ibuprofen) you should stop taking it 7 days prior to your surgery.  You make take Tylenol  For pain prior to surgery.  5. Medications- If you are taking daily heart and blood pressure medications, seizure, reflux, allergy, asthma, anxiety, pain or diabetes medications, make sure the surgery center/hospital is aware before the day of surgery so they may notify you which medications to take or avoid the day of surgery. 6. No food or drink after midnight the night before surgery unless directed otherwise by surgical center/hospital staff. 7. No alcoholic beverages 24 hours prior to surgery.  No smoking 24 hours prior to or 24 hours after surgery. 8. Wear loose pants or shorts- loose enough to fit over bandages, boots, and casts. 9. No slip on shoes, sneakers are best. 10. Bring  your boot with you to the surgery center/hospital.  Also bring crutches or a walker if your physician has prescribed it for you.  If you do not have this equipment, it will be provided for you after surgery. 11. If you have not been contracted by the surgery center/hospital by the day before your surgery, call to confirm the date and time of your surgery. 12. Leave-time from work may vary depending on the type of surgery you have.  Appropriate arrangements should be made prior to surgery with your employer. 13. Prescriptions will be provided immediately following surgery by your doctor.  Have these filled as soon as possible after surgery and take the medication as directed. 14. Remove nail polish on the operative foot. 15. Wash the night before surgery.  The night before surgery wash the foot and leg well with the antibacterial soap provided and water paying special attention to beneath the toenails and in between the toes.  Rinse thoroughly with water and dry well with a towel.  Perform this wash unless told not to do so by your physician.  Enclosed: 1 Ice pack (please put in freezer the night before surgery)   1 Hibiclens skin cleaner   Pre-op Instructions  If you have any questions regarding the instructions, do not hesitate to call our office.  [place]: [address] [place], [place] [address] [phone]  [place]: [address] [place], [place] [address] [phone]  [place]: [address]  [place], [place] [address] [phone]  Dr. Richard   Tuchman DPM, Dr. Norman Regal DPM Dr. Richard Sikora DPM, Dr. M. Todd Hyatt DPM, Dr. Kathryn Egerton DPM 

## 2016-01-12 NOTE — Progress Notes (Signed)
Subjective:     Patient ID: Audrey Baxter, female   DOB: 1956-04-02, 60 y.o.   MRN: [id number]  HPI patient presents stating I have this painful bunion right that's making it hard for me to wear shoe gear comfortably and I need to get it corrected   Review of Systems     Objective:   Physical Exam  neurovascular status intact muscle strength adequate with redness and pain first metatarsal head right with deviation of the big toe against the second toe    Assessment:      structural bunion deformity right    Plan:      reviewed condition again and patient wants surgery and I allowed her to again review consent form and what we would do going over alternative treatments complications and length of recovery of her proximally 6 months. She wants surgery and is scheduled again for outpatient surgery and I dispensed fracture walker with instructions on usage

## 2016-01-22 MED FILL — DOXYCYCLINE HYCLATE 100 MG: 100 | 10 days supply | Qty: 20 | Fill #0

## 2016-01-26 MED FILL — BUPROPION HCL XL 300 MG TAB: 300 | 30 days supply | Qty: 30 | Fill #5 | Status: TO

## 2016-01-26 MED FILL — PRISTIQ ER 100 MG TABLET: 100 | 30 days supply | Qty: 30 | Fill #5 | Status: TO

## 2016-02-02 MED FILL — ZOLPIDEM TARTRATE 10 MG TAB: 10 | 30 days supply | Qty: 30 | Fill #1

## 2016-02-02 MED FILL — ARIPiprazole 10 MG TABS: 10 | 30 days supply | Qty: 30 | Fill #2

## 2016-02-02 MED FILL — PRIMIDONE 50 MG TABLET: 50 | 30 days supply | Qty: 150 | Fill #0

## 2016-02-05 DIAGNOSIS — L812 Freckles: Secondary | ICD-10-CM | POA: Diagnosis not present

## 2016-02-05 DIAGNOSIS — D1801 Hemangioma of skin and subcutaneous tissue: Secondary | ICD-10-CM | POA: Diagnosis not present

## 2016-02-05 DIAGNOSIS — Z85820 Personal history of malignant melanoma of skin: Secondary | ICD-10-CM | POA: Diagnosis not present

## 2016-02-05 DIAGNOSIS — D225 Melanocytic nevi of trunk: Secondary | ICD-10-CM | POA: Diagnosis not present

## 2016-02-05 DIAGNOSIS — D2271 Melanocytic nevi of right lower limb, including hip: Secondary | ICD-10-CM | POA: Diagnosis not present

## 2016-02-05 DIAGNOSIS — L821 Other seborrheic keratosis: Secondary | ICD-10-CM | POA: Diagnosis not present

## 2016-02-05 DIAGNOSIS — L578 Other skin changes due to chronic exposure to nonionizing radiation: Secondary | ICD-10-CM | POA: Diagnosis not present

## 2016-02-08 DIAGNOSIS — R05 Cough: Secondary | ICD-10-CM | POA: Diagnosis not present

## 2016-02-08 MED FILL — predniSONE 5 MG TABS: 5 | 6 days supply | Qty: 21 | Fill #0

## 2016-02-16 ENCOUNTER — Telehealth: Payer: Self-pay | Admitting: *Deleted

## 2016-02-16 DIAGNOSIS — I10 Essential (primary) hypertension: Secondary | ICD-10-CM | POA: Diagnosis not present

## 2016-02-16 DIAGNOSIS — M2011 Hallux valgus (acquired), right foot: Secondary | ICD-10-CM | POA: Diagnosis not present

## 2016-02-16 MED FILL — OXYCODONE-APAP 10-325 TAB: 10-325 | 5 days supply | Qty: 30 | Fill #0

## 2016-02-16 NOTE — Telephone Encounter (Signed)
Pharmacist states pt has been prescribed Percocet 10/325, and is on other medications that have a sedative affect, does Dr. Paulla Dolly want to continue with the rx.  Dr. Paulla Dolly states pt has had surgery and needs pain relief medication.  Pharmacist states he will counsel pt on the sedative affect and can even use 1/2 dosing.  Dr. Josephina Shih.

## 2016-02-18 ENCOUNTER — Telehealth: Payer: Self-pay | Admitting: *Deleted

## 2016-02-18 NOTE — Telephone Encounter (Signed)
Pt states she is taking the Oxycodone every 4 hours but it wears off at 3 hours can she take another at 3 hours.  I asked pt if she was able to take OTC Ibuprofen and she stated she could.  I instructed pt to take Ibuprofen as OTC package directs in between the dosing of Oxycodone to increase pain coverage.  I told pt now to be up on the foot more than 5 minutes/hour the 1st week post-op and not to dangle foot.  Pt asked if she had to be in the boot at all times.  I told pt she had to be in the boot at all times, except if she was resting and not going to sleep she could open the surgical boot and rotate the foot and adjust the straps.  Pt states understanding.

## 2016-02-23 ENCOUNTER — Telehealth: Payer: Self-pay | Admitting: *Deleted

## 2016-02-23 NOTE — Telephone Encounter (Addendum)
Pt states she has an question about her recovery.  Left message to call with concerns.  I spoke with pt and she says the surgical boot is moving around loosely on the surgical foot and irritating the area, and she wanted to know if she could put air in the boot.  I told pt she could put air in the boot, but if she decreased her up and around time to 5 minutes per hour she may not need to do that.  Pt agreed and said she'll do both.

## 2016-02-26 ENCOUNTER — Ambulatory Visit (INDEPENDENT_AMBULATORY_CARE_PROVIDER_SITE_OTHER): Payer: 59 | Admitting: Podiatry

## 2016-02-26 ENCOUNTER — Ambulatory Visit (INDEPENDENT_AMBULATORY_CARE_PROVIDER_SITE_OTHER): Payer: 59

## 2016-02-26 ENCOUNTER — Encounter: Payer: Self-pay | Admitting: Podiatry

## 2016-02-26 VITALS — BP 152/82 | HR 59 | Resp 16

## 2016-02-26 DIAGNOSIS — M21612 Bunion of left foot: Secondary | ICD-10-CM

## 2016-02-26 DIAGNOSIS — M21619 Bunion of unspecified foot: Secondary | ICD-10-CM

## 2016-02-26 DIAGNOSIS — Z9889 Other specified postprocedural states: Secondary | ICD-10-CM

## 2016-02-26 MED FILL — BUPROPION HCL XL 300 MG TAB: 300 | 30 days supply | Qty: 30 | Fill #0 | Status: TO

## 2016-02-26 MED FILL — PRISTIQ ER 100 MG TABLET: 100 | 30 days supply | Qty: 30 | Fill #0 | Status: TO

## 2016-02-27 NOTE — Progress Notes (Signed)
Subjective:     Patient ID: Audrey Baxter, female   DOB: 30-Jan-1956, 60 y.o.   MRN: [id number]  HPI patient states that she is doing real well with her right foot with minimal discomfort or swelling   Review of Systems     Objective:   Physical Exam Neurovascular status intact muscle strength adequate with well-healing surgical site right first metatarsal with wound edges well coapted and rectus position of the hallux and negative Homans sign was noted    Assessment:     Doing well post osteotomy first metatarsal right with good alignment noted    Plan:     H&P and x-rays reviewed. Instructed on range of motion exercises reapplied sterile dressing and continue immobilization compression elevation. Reappoint in 3 weeks or earlier if any issues should occur  Poor indicates good alignment of the first metatarsal with joint congruence pins in place and no signs of movement

## 2016-02-29 MED FILL — ROSUVASTATIN CALCIUM 10 MG: 10 | 90 days supply | Qty: 90 | Fill #0

## 2016-03-02 DIAGNOSIS — F4321 Adjustment disorder with depressed mood: Secondary | ICD-10-CM | POA: Diagnosis not present

## 2016-03-09 DIAGNOSIS — F4321 Adjustment disorder with depressed mood: Secondary | ICD-10-CM | POA: Diagnosis not present

## 2016-03-15 MED FILL — HYDROCHLOROTHIAZIDE 25 MG T: 25 | 90 days supply | Qty: 90 | Fill #2

## 2016-03-16 DIAGNOSIS — F4321 Adjustment disorder with depressed mood: Secondary | ICD-10-CM | POA: Diagnosis not present

## 2016-03-21 ENCOUNTER — Ambulatory Visit (INDEPENDENT_AMBULATORY_CARE_PROVIDER_SITE_OTHER): Payer: 59 | Admitting: Podiatry

## 2016-03-21 ENCOUNTER — Ambulatory Visit: Payer: Self-pay

## 2016-03-21 DIAGNOSIS — M21619 Bunion of unspecified foot: Secondary | ICD-10-CM

## 2016-03-21 DIAGNOSIS — Z9889 Other specified postprocedural states: Secondary | ICD-10-CM

## 2016-03-21 MED FILL — PRIMIDONE 50 MG TABLET: 50 | 30 days supply | Qty: 150 | Fill #1

## 2016-03-21 MED FILL — LISINOPRIL 20 MG TABLET: 20 | 60 days supply | Qty: 60 | Fill #2

## 2016-03-22 DIAGNOSIS — F4321 Adjustment disorder with depressed mood: Secondary | ICD-10-CM | POA: Diagnosis not present

## 2016-03-23 NOTE — Progress Notes (Signed)
Subjective:     Patient ID: Audrey Baxter, female   DOB: 07/29/1956, 60 y.o.   MRN: [id number]  HPI patient states she's doing really well with minimal discomfort or swelling   Review of Systems     Objective:   Physical Exam Neurovascular status intact with right foot doing well with joint in good alignment and minimal edema and wound edges healing well    Assessment:     Doing well post Liane Comber osteotomy right    Plan:     Advised on physical therapy anti-inflammatory gradual return soft shoe and reviewed x-rays. Reappoint 4 weeks or earlier if needed  X-ray report indicated osteotomy intact pin in place with no indications of pathology or movement

## 2016-03-30 DIAGNOSIS — M25521 Pain in right elbow: Secondary | ICD-10-CM | POA: Diagnosis not present

## 2016-03-30 DIAGNOSIS — S59801A Other specified injuries of right elbow, initial encounter: Secondary | ICD-10-CM | POA: Diagnosis not present

## 2016-03-30 MED FILL — DESVENLAFAXINE SUC ER 100 M: 100 | 90 days supply | Qty: 90 | Fill #0 | Status: TO

## 2016-03-30 MED FILL — BUPROPION HCL XL 300 MG TAB: 300 | 90 days supply | Qty: 90 | Fill #0 | Status: TO

## 2016-04-08 MED FILL — ZOLPIDEM TARTRATE 10 MG TAB: 10 | 30 days supply | Qty: 30 | Fill #2

## 2016-04-11 MED FILL — ARIPiprazole 10 MG TABS: 10 | 90 days supply | Qty: 90 | Fill #0

## 2016-04-14 DIAGNOSIS — F4321 Adjustment disorder with depressed mood: Secondary | ICD-10-CM | POA: Diagnosis not present

## 2016-04-20 ENCOUNTER — Other Ambulatory Visit: Payer: 59

## 2016-04-20 DIAGNOSIS — F4321 Adjustment disorder with depressed mood: Secondary | ICD-10-CM | POA: Diagnosis not present

## 2016-04-25 ENCOUNTER — Encounter: Payer: Self-pay | Admitting: Podiatry

## 2016-04-25 ENCOUNTER — Ambulatory Visit (INDEPENDENT_AMBULATORY_CARE_PROVIDER_SITE_OTHER): Payer: 59

## 2016-04-25 ENCOUNTER — Ambulatory Visit (INDEPENDENT_AMBULATORY_CARE_PROVIDER_SITE_OTHER): Payer: 59 | Admitting: Podiatry

## 2016-04-25 DIAGNOSIS — M21619 Bunion of unspecified foot: Secondary | ICD-10-CM | POA: Diagnosis not present

## 2016-04-25 DIAGNOSIS — Z9889 Other specified postprocedural states: Secondary | ICD-10-CM

## 2016-04-26 NOTE — Progress Notes (Signed)
Subjective:     Patient ID: Audrey Baxter, female   DOB: 15-Jun-1956, 60 y.o.   MRN: [id number]  HPI patient states I'm doing well with my right foot   Review of Systems     Objective:   Physical Exam Neurovascular status intact negative Homans sign noted with patient's right foot doing well with wound edges well coapted and hallux in rectus position    Assessment:     Doing well post Austin-type osteotomy right    Plan:     X-ray reviewed and advised on importance of range of motion exercises and patient will be seen back for routine visit in the next 6-8 weeks. Reviewed x-ray  X-ray report indicates good healing of osteotomy with pins in place no signs of movement and joint congruence

## 2016-04-27 DIAGNOSIS — F4321 Adjustment disorder with depressed mood: Secondary | ICD-10-CM | POA: Diagnosis not present

## 2016-05-18 DIAGNOSIS — F4321 Adjustment disorder with depressed mood: Secondary | ICD-10-CM | POA: Diagnosis not present

## 2016-05-23 MED FILL — PRIMIDONE 50 MG TABLET: 50 | 30 days supply | Qty: 150 | Fill #0

## 2016-05-25 DIAGNOSIS — F4321 Adjustment disorder with depressed mood: Secondary | ICD-10-CM | POA: Diagnosis not present

## 2016-06-01 DIAGNOSIS — F4321 Adjustment disorder with depressed mood: Secondary | ICD-10-CM | POA: Diagnosis not present

## 2016-06-06 MED FILL — LISINOPRIL 20 MG TABLET: 20 | 30 days supply | Qty: 30 | Fill #0

## 2016-06-08 DIAGNOSIS — F4321 Adjustment disorder with depressed mood: Secondary | ICD-10-CM | POA: Diagnosis not present

## 2016-06-13 MED FILL — ZOLPIDEM TARTRATE 10 MG TAB: 10 | 90 days supply | Qty: 90 | Fill #0

## 2016-06-13 MED FILL — ROSUVASTATIN CALCIUM 10 MG: 10 | 90 days supply | Qty: 90 | Fill #1 | Status: TO

## 2016-06-16 MED FILL — DEXTROAMP-AMPHETAMIN 20 MG: 20 | 90 days supply | Qty: 180 | Fill #0

## 2016-06-20 DIAGNOSIS — F5101 Primary insomnia: Secondary | ICD-10-CM | POA: Diagnosis not present

## 2016-06-20 DIAGNOSIS — E782 Mixed hyperlipidemia: Secondary | ICD-10-CM | POA: Diagnosis not present

## 2016-06-20 DIAGNOSIS — G250 Essential tremor: Secondary | ICD-10-CM | POA: Diagnosis not present

## 2016-06-20 DIAGNOSIS — I10 Essential (primary) hypertension: Secondary | ICD-10-CM | POA: Diagnosis not present

## 2016-06-20 MED FILL — HYDROCHLOROTHIAZIDE 25 MG T: 25 | 90 days supply | Qty: 90 | Fill #0 | Status: TO

## 2016-06-22 ENCOUNTER — Ambulatory Visit (INDEPENDENT_AMBULATORY_CARE_PROVIDER_SITE_OTHER): Payer: 59 | Admitting: Neurology

## 2016-06-22 ENCOUNTER — Encounter: Payer: Self-pay | Admitting: Neurology

## 2016-06-22 ENCOUNTER — Other Ambulatory Visit: Payer: 59

## 2016-06-22 VITALS — BP 110/64 | HR 68 | Ht 63.0 in | Wt 136.0 lb

## 2016-06-22 DIAGNOSIS — Z5181 Encounter for therapeutic drug level monitoring: Secondary | ICD-10-CM

## 2016-06-22 DIAGNOSIS — G250 Essential tremor: Secondary | ICD-10-CM

## 2016-06-22 DIAGNOSIS — G609 Hereditary and idiopathic neuropathy, unspecified: Secondary | ICD-10-CM

## 2016-06-22 DIAGNOSIS — F4321 Adjustment disorder with depressed mood: Secondary | ICD-10-CM | POA: Diagnosis not present

## 2016-06-22 LAB — COMPREHENSIVE METABOLIC PANEL
ALBUMIN: 4.3 g/dL (ref 3.6–5.1)
ALT: 15 U/L (ref 6–29)
AST: 19 U/L (ref 10–35)
Alkaline Phosphatase: 70 U/L (ref 33–130)
BILIRUBIN TOTAL: 0.3 mg/dL (ref 0.2–1.2)
BUN: 13 mg/dL (ref 7–25)
CALCIUM: 9.4 mg/dL (ref 8.6–10.4)
CHLORIDE: 92 mmol/L — AB (ref 98–110)
CO2: 26 mmol/L (ref 20–31)
CREATININE: 0.63 mg/dL (ref 0.50–0.99)
Glucose, Bld: 87 mg/dL (ref 65–99)
Potassium: 4.4 mmol/L (ref 3.5–5.3)
SODIUM: 132 mmol/L — AB (ref 135–146)
TOTAL PROTEIN: 6.9 g/dL (ref 6.1–8.1)

## 2016-06-22 MED ORDER — PRIMIDONE 50 MG PO TABS: 50.0000 mg | ORAL_TABLET | ORAL | Status: DC

## 2016-06-22 MED FILL — PRIMIDONE 50 MG TABLET: 50 | 90 days supply | Qty: 450 | Fill #0 | Status: TO

## 2016-06-22 NOTE — Progress Notes (Signed)
Subjective:   Audrey Baxter was seen in consultation in the movement disorder clinic at the request of Walters-Stewart, Gifford Shave, MD.  The evaluation is for tremor.  I have reviewed the patient's prior records from her primary care physician, her neurologist and from her otolaryngologist (seen July 2016 for hearing loss).  The patient previously saw Dr. Tonye Royalty regarding tremor and last saw his nurse practitioner on 03/10/2015.  The patient reports that she has had tremor in the hands virtually all of her life and also recalls voice tremor for nearly that long.  Head tremor began in approximately 2013 per records but pt denies any head tremor.  She was started on primidone in November, 2014 and has worked up to 125 mg twice per day (2 q AM, 2 q afternoon, 1 q hs).  She is also on propranolol 20 mg as needed but she never takes that.  The patient is also on Abilify and has been on that for several years (4-5 years - she isn't sure that this affected tremor).  She was treated by Dr. Raelyn Number at the [facility] for depression but is now being seen by Dr. Buddy Duty.  The patient also has a history of large fiber peripheral neuropathy and has had falls because of this.  It appears that she has refused EMG in the past.  She does state that balance is better than it used to be.    Tremor: Affected by caffeine:  Unknown as drinks much caffeine per day (drinks 6 cans diet coke/day) Affected by alcohol:  No. Affected by stress:  Yes.   Affected by fatigue:  No. Spills soup if on spoon:  No. but very careful and very subconscious about it in public Spills glass of liquid if full:  No. but very careful Affects ADL's (tying shoes, brushing teeth, etc):  No.  Current/Previously tried tremor medications: primidone, rarely propranolol prn  Current medications that may exacerbate tremor:  abilify  Outside reports reviewed: historical medical records and referral letter/letters.  Allergies    Allergen Reactions  . Cephalexin   . Erythromycin Base Other (See Comments)  . Pyridium [Phenazopyridine Hcl]     Outpatient Encounter Prescriptions as of 06/22/2016  Medication Sig  . ARIPiprazole (ABILIFY) 5 MG tablet   . buPROPion (WELLBUTRIN XL) 150 MG 24 hr tablet Take 150 mg by mouth daily.    Marland Kitchen desvenlafaxine (PRISTIQ) 100 MG 24 hr tablet Take 100 mg by mouth daily.    Marland Kitchen lisinopril (PRINIVIL,ZESTRIL) 10 MG tablet Take 10 mg by mouth daily.  . primidone (MYSOLINE) 50 MG tablet Take 50 mg by mouth. 2 in the morning, 2 in the afternoon, 1 in the evening  . rosuvastatin (CRESTOR) 20 MG tablet Take 20 mg by mouth daily.  Marland Kitchen zolpidem (AMBIEN) 5 MG tablet Take 5 mg by mouth at bedtime.     No facility-administered encounter medications on file as of 06/22/2016.    Past Medical History  Diagnosis Date  . Hypertension   . Tremor   . Depression   . Hyperlipidemia     Past Surgical History  Procedure Laterality Date  . Wrist arthroplasty  2007  . Bunionectomy Bilateral     Social History   Social History  . Marital Status: Married    Spouse Name: N/A  . Number of Children: N/A  . Years of Education: N/A   Occupational History  . chaplain     [employer]   Social History Main  Topics  . Smoking status: Former Smoker    Quit date: 12/19/1978  . Smokeless tobacco: Never Used  . Alcohol Use: 0.0 oz/week    0 Standard drinks or equivalent per week     Comment: glass wine every other night  . Drug Use: No  . Sexual Activity: Not on file   Other Topics Concern  . Not on file   Social History Narrative    Family Status  Relation Status Death Age  . Mother Deceased     COPD  . Father Deceased     CHF  . Brother Deceased     x2 - HIV, lung cancer  . Brother Alive     healthy    Review of Systems Paresthesias in fingers bilaterally, and PCP told her that thought was CTS.  Wakes up with them numb.  A complete 10 system ROS was obtained and was negative apart from what  is mentioned.   Objective:   VITALS:   Filed Vitals:   06/22/16 0801  BP: 110/64  Pulse: 68  Height: 5\' 3"  (1.6 m)  Weight: 136 lb (61.689 kg)   Gen:  Appears stated age and in NAD. HEENT:  Normocephalic, atraumatic. The mucous membranes are moist. The superficial temporal arteries are without ropiness or tenderness. Cardiovascular: Regular rate and rhythm. Lungs: Clear to auscultation bilaterally. Neck: There are no carotid bruits noted bilaterally.  NEUROLOGICAL:  Orientation:  The patient is alert and oriented x 3.  Recent and remote memory are intact.  Attention span and concentration are normal.  Able to name objects and repeat without trouble.  Fund of knowledge is appropriate Cranial nerves: There is good facial symmetry. The pupils are equal round and reactive to light bilaterally. Fundoscopic exam reveals clear disc margins bilaterally. Extraocular muscles are intact and visual fields are full to confrontational testing. Speech is fluent and clear. Soft palate rises symmetrically and there is no tongue deviation. Hearing is intact to conversational tone. Tone: Tone is good throughout. Sensation: Sensation is intact to light touch and pinprick throughout (facial, trunk, extremities). Vibration is intact at the bilateral big toe but slightly decreased distally, L more than right. There is no extinction with double simultaneous stimulation. There is no sensory dermatomal level identified. Coordination:  The patient has no dysdiadichokinesia or dysmetria. Motor: Strength is 5/5 in the bilateral upper and lower extremities.  Shoulder shrug is equal bilaterally.  There is no pronator drift.  There are no fasciculations noted. DTR's: Deep tendon reflexes are 2+-3/4 at the bilateral biceps, triceps, brachioradialis, patella and achilles.  Plantar responses are downgoing bilaterally. Gait and Station: The patient is able to ambulate without difficulty. The patient is able to heel toe walk  without any difficulty. The patient is able to ambulate in a tandem fashion. The patient is able to stand in the Romberg position.   MOVEMENT EXAM: Tremor:  There is mild to mod tremor in the UE, noted most significantly with action.  The patient has minimal trouble with archimedes spirals.  There is no tremor at rest.  The patient is not able to pour water from one glass to another without spilling it (spills a little bit when glass is in either hand).     Assessment/Plan:   1.  Essential Tremor.  -This is evidenced by the symmetrical nature and longstanding hx of gradually getting worse.  We discussed nature and pathophysiology.  We discussed that this can continue to gradually get worse with time.  We discussed that some medications can worsen this, as can caffeine use.  We discussed medication therapy as well as surgical therapy.  She is not interested in DBS right now.   She was started on primidone in November, 2014 and has worked up to 125 mg twice per day.  She has propranolol 20 mg as needed but rarely uses it and it sounds like consistent use of this increased depression.  -will do labs as has been over a year.  -talked to her about getting PCP locally as she is trying to move her physicans back here (had moved to [place] and moved back).  Names given.  -did tell her that abilify could cause parkinsonian tremor but didn't see any of that today and wouldn't recommend changing that  -talked to her about drastically decreasing and maybe d/c diet coke  2.  Peripheral neuropathy, by hx  -seems fairly mild on exam and is quite hyperreflexic on exam, which is unusual for PN.  However, states that she has always had hyperreflexia even as kid.  No neck/back pain and nothing focal or lateralizing.  -safety discussed    3.  F/u 6-8 months, sooner should new neuro issues arise.  Much greater than 50% of this visit was spent in counseling and coordinating care.  Total face to face time:  60  min

## 2016-06-22 NOTE — Patient Instructions (Addendum)
1.  You may want to try Dr. Dimple Nanas at [facility] [phone] 2. Your provider has requested that you have labwork completed today. Please go to [facility] ([id number]) on the second floor of this building before leaving the office today. You do not need to check in. If you are not called within 15 minutes please check with the front desk.

## 2016-06-23 LAB — CBC WITH DIFFERENTIAL/PLATELET
BASOS ABS: 39 {cells}/uL (ref 0–200)
Basophils Relative: 1 %
EOS ABS: 117 {cells}/uL (ref 15–500)
EOS PCT: 3 %
HEMATOCRIT: 34.9 % — AB (ref 35.0–45.0)
HEMOGLOBIN: 12 g/dL (ref 11.7–15.5)
LYMPHS ABS: 1248 {cells}/uL (ref 850–3900)
Lymphocytes Relative: 32 %
MCH: 31.2 pg (ref 27.0–33.0)
MCHC: 34.4 g/dL (ref 32.0–36.0)
MCV: 90.6 fL (ref 80.0–100.0)
MONO ABS: 351 {cells}/uL (ref 200–950)
MPV: 9.3 fL (ref 7.5–12.5)
Monocytes Relative: 9 %
NEUTROS ABS: 2145 {cells}/uL (ref 1500–7800)
NEUTROS PCT: 55 %
Platelets: 351 10*3/uL (ref 140–400)
RBC: 3.85 MIL/uL (ref 3.80–5.10)
RDW: 12.8 % (ref 11.0–15.0)
WBC: 3.9 10*3/uL (ref 3.8–10.8)

## 2016-06-23 LAB — PHENOBARBITAL LEVEL

## 2016-06-24 LAB — PRIMIDONE, SERUM
PHENOBARBITAL, SERUM: NOT DETECTED ug/mL (ref 15–40)
Primidone Lvl: 11.1 ug/mL (ref 5.0–12.0)

## 2016-06-29 DIAGNOSIS — F4321 Adjustment disorder with depressed mood: Secondary | ICD-10-CM | POA: Diagnosis not present

## 2016-07-06 DIAGNOSIS — F4321 Adjustment disorder with depressed mood: Secondary | ICD-10-CM | POA: Diagnosis not present

## 2016-07-06 MED FILL — LISINOPRIL 20 MG TABLET: 20 | 90 days supply | Qty: 90 | Fill #0 | Status: TO

## 2016-07-06 MED FILL — DESVENLAFAXINE SUC ER 100 M: 100 | 90 days supply | Qty: 90 | Fill #0 | Status: TO

## 2016-07-06 MED FILL — BUPROPION HCL XL 300 MG TAB: 300 | 90 days supply | Qty: 90 | Fill #0 | Status: TO

## 2016-07-14 DIAGNOSIS — F4321 Adjustment disorder with depressed mood: Secondary | ICD-10-CM | POA: Diagnosis not present

## 2016-07-20 DIAGNOSIS — F4321 Adjustment disorder with depressed mood: Secondary | ICD-10-CM | POA: Diagnosis not present

## 2016-07-27 DIAGNOSIS — F4321 Adjustment disorder with depressed mood: Secondary | ICD-10-CM | POA: Diagnosis not present

## 2016-08-03 DIAGNOSIS — F4321 Adjustment disorder with depressed mood: Secondary | ICD-10-CM | POA: Diagnosis not present

## 2016-08-12 ENCOUNTER — Encounter: Payer: Self-pay | Admitting: *Deleted

## 2016-08-12 NOTE — Progress Notes (Signed)
   DOS  Austin bunionectomy right

## 2016-08-16 DIAGNOSIS — Z23 Encounter for immunization: Secondary | ICD-10-CM | POA: Diagnosis not present

## 2016-08-16 DIAGNOSIS — H938X2 Other specified disorders of left ear: Secondary | ICD-10-CM | POA: Diagnosis not present

## 2016-08-19 DIAGNOSIS — F4321 Adjustment disorder with depressed mood: Secondary | ICD-10-CM | POA: Diagnosis not present

## 2016-09-07 DIAGNOSIS — F4321 Adjustment disorder with depressed mood: Secondary | ICD-10-CM | POA: Diagnosis not present

## 2016-09-08 MED FILL — ROSUVASTATIN CALCIUM 10 MG: 10 | 90 days supply | Qty: 90 | Fill #0

## 2016-09-20 MED FILL — GAVILYTE-N SOLUTION: 420 | 1 days supply | Qty: 4000 | Fill #0

## 2016-09-27 DIAGNOSIS — K635 Polyp of colon: Secondary | ICD-10-CM | POA: Diagnosis not present

## 2016-09-27 DIAGNOSIS — Z1211 Encounter for screening for malignant neoplasm of colon: Secondary | ICD-10-CM | POA: Diagnosis not present

## 2016-09-27 DIAGNOSIS — D126 Benign neoplasm of colon, unspecified: Secondary | ICD-10-CM | POA: Diagnosis not present

## 2016-09-27 DIAGNOSIS — K640 First degree hemorrhoids: Secondary | ICD-10-CM | POA: Diagnosis not present

## 2016-09-27 DIAGNOSIS — D125 Benign neoplasm of sigmoid colon: Secondary | ICD-10-CM | POA: Diagnosis not present

## 2016-09-27 LAB — HM COLONOSCOPY

## 2016-10-07 MED FILL — ARIPiprazole 10 MG TABS: 10 | 90 days supply | Qty: 90 | Fill #0

## 2016-10-07 MED FILL — LISINOPRIL 20 MG TABLET: 20 | 90 days supply | Qty: 90 | Fill #0

## 2016-10-07 MED FILL — HYDROCHLOROTHIAZIDE 25 MG T: 25 | 90 days supply | Qty: 90 | Fill #0

## 2016-10-07 MED FILL — DESVENLAFAXINE SUC ER 100 M: 100 | 90 days supply | Qty: 90 | Fill #0

## 2016-10-07 MED FILL — BUPROPION HCL XL 300 MG TAB: 300 | 90 days supply | Qty: 90 | Fill #0

## 2016-10-12 ENCOUNTER — Encounter: Payer: Self-pay | Admitting: General Practice

## 2016-10-28 MED FILL — PRIMIDONE 50 MG TABLET: 50 | 90 days supply | Qty: 450 | Fill #0

## 2016-10-31 ENCOUNTER — Encounter: Payer: Self-pay | Admitting: General Practice

## 2016-11-15 MED FILL — ZOLPIDEM TARTRATE 10 MG TAB: 10 | 90 days supply | Qty: 90 | Fill #0

## 2016-11-16 ENCOUNTER — Ambulatory Visit (INDEPENDENT_AMBULATORY_CARE_PROVIDER_SITE_OTHER): Payer: 59 | Admitting: Podiatry

## 2016-11-16 ENCOUNTER — Encounter: Payer: Self-pay | Admitting: Podiatry

## 2016-11-16 ENCOUNTER — Ambulatory Visit (INDEPENDENT_AMBULATORY_CARE_PROVIDER_SITE_OTHER): Payer: 59

## 2016-11-16 DIAGNOSIS — M79671 Pain in right foot: Secondary | ICD-10-CM

## 2016-11-16 DIAGNOSIS — M779 Enthesopathy, unspecified: Secondary | ICD-10-CM

## 2016-11-17 NOTE — Progress Notes (Signed)
Subjective:     Patient ID: Audrey Baxter, female   DOB: October 23, 1956, 60 y.o.   MRN: [id number]  HPI patient presents concerned because she still feels like there is some roundness to the side of the metatarsal and it may be irritating when she wears tighter shoes   Review of Systems     Objective:   Physical Exam Neurovascular status intact with incision that's healed well with mild roundness to the first MPJ right but no current redness noted with patient having a flexible foot type and splayed type metatarsal    Assessment:     Does have some residual deformity with a lot of this due to the splayed foot type    Plan:     At this point we will watch it but I did explain that if it were to become significant as far as symptoms go that eventually we may need to consider Lapidus fusion with plate. Patient understands this were to hold off currently and at this time I did make orthotics to try to  provide better support

## 2016-11-24 ENCOUNTER — Encounter: Payer: Self-pay | Admitting: Family Medicine

## 2016-11-24 ENCOUNTER — Ambulatory Visit (INDEPENDENT_AMBULATORY_CARE_PROVIDER_SITE_OTHER): Payer: 59 | Admitting: Family Medicine

## 2016-11-24 VITALS — BP 112/68 | HR 63 | Temp 98.1°F | Resp 16 | Ht 63.0 in | Wt 144.1 lb

## 2016-11-24 DIAGNOSIS — I10 Essential (primary) hypertension: Secondary | ICD-10-CM

## 2016-11-24 DIAGNOSIS — F331 Major depressive disorder, recurrent, moderate: Secondary | ICD-10-CM | POA: Diagnosis not present

## 2016-11-24 DIAGNOSIS — R413 Other amnesia: Secondary | ICD-10-CM | POA: Diagnosis not present

## 2016-11-24 DIAGNOSIS — Z1231 Encounter for screening mammogram for malignant neoplasm of breast: Secondary | ICD-10-CM

## 2016-11-24 DIAGNOSIS — F329 Major depressive disorder, single episode, unspecified: Secondary | ICD-10-CM

## 2016-11-24 DIAGNOSIS — E785 Hyperlipidemia, unspecified: Secondary | ICD-10-CM

## 2016-11-24 DIAGNOSIS — F339 Major depressive disorder, recurrent, unspecified: Secondary | ICD-10-CM | POA: Insufficient documentation

## 2016-11-24 HISTORY — DX: Essential (primary) hypertension: I10

## 2016-11-24 HISTORY — DX: Major depressive disorder, single episode, unspecified: F32.9

## 2016-11-24 LAB — CBC WITH DIFFERENTIAL/PLATELET
BASOS ABS: 59 {cells}/uL (ref 0–200)
Basophils Relative: 1 %
EOS PCT: 2 %
Eosinophils Absolute: 118 cells/uL (ref 15–500)
HCT: 34.6 % — ABNORMAL LOW (ref 35.0–45.0)
Hemoglobin: 11.6 g/dL — ABNORMAL LOW (ref 11.7–15.5)
LYMPHS PCT: 38 %
Lymphs Abs: 2242 cells/uL (ref 850–3900)
MCH: 30.7 pg (ref 27.0–33.0)
MCHC: 33.5 g/dL (ref 32.0–36.0)
MCV: 91.5 fL (ref 80.0–100.0)
MONOS PCT: 9 %
MPV: 9.4 fL (ref 7.5–12.5)
Monocytes Absolute: 531 cells/uL (ref 200–950)
NEUTROS ABS: 2950 {cells}/uL (ref 1500–7800)
Neutrophils Relative %: 50 %
PLATELETS: 314 10*3/uL (ref 140–400)
RBC: 3.78 MIL/uL — ABNORMAL LOW (ref 3.80–5.10)
RDW: 12.5 % (ref 11.0–15.0)
WBC: 5.9 10*3/uL (ref 3.8–10.8)

## 2016-11-24 LAB — HEPATIC FUNCTION PANEL
ALBUMIN: 4.7 g/dL (ref 3.6–5.1)
ALK PHOS: 75 U/L (ref 33–130)
ALT: 16 U/L (ref 6–29)
AST: 18 U/L (ref 10–35)
Bilirubin, Direct: 0.1 mg/dL (ref <=0.2)
Indirect Bilirubin: 0.2 mg/dL (ref 0.2–1.2)
TOTAL PROTEIN: 6.9 g/dL (ref 6.1–8.1)
Total Bilirubin: 0.3 mg/dL (ref 0.2–1.2)

## 2016-11-24 LAB — BASIC METABOLIC PANEL
BUN: 9 mg/dL (ref 7–25)
CALCIUM: 9.6 mg/dL (ref 8.6–10.4)
CO2: 29 mmol/L (ref 20–31)
Chloride: 93 mmol/L — ABNORMAL LOW (ref 98–110)
Creat: 0.76 mg/dL (ref 0.50–0.99)
GLUCOSE: 92 mg/dL (ref 65–99)
POTASSIUM: 3.9 mmol/L (ref 3.5–5.3)
Sodium: 129 mmol/L — ABNORMAL LOW (ref 135–146)

## 2016-11-24 LAB — TSH: TSH: 1.14 m[IU]/L

## 2016-11-24 LAB — LIPID PANEL
CHOL/HDL RATIO: 1.9 ratio (ref <5.0)
CHOLESTEROL: 175 mg/dL (ref <200)
HDL: 94 mg/dL (ref >50)
LDL Cholesterol: 67 mg/dL (ref <100)
Triglycerides: 72 mg/dL (ref <150)
VLDL: 14 mg/dL (ref <30)

## 2016-11-24 NOTE — Patient Instructions (Signed)
Schedule your complete physical in 6 months We'll notify you of your lab results and make any changes if needed We'll call you with your mammogram appt We'll call you with your neuropsych testing for the memory issues Try and work on stress management- you deserve time for yourself!!! Call with any questions or concerns Welcome!  We're glad to have you!!! Happy Holidays!!!

## 2016-11-24 NOTE — Progress Notes (Signed)
   Subjective:    Patient ID: Audrey Baxter, female    DOB: 19-Jul-1956, 60 y.o.   MRN: [id number]  HPI New to establish.  Previous MD- Jennette Kettle ([place])  Psych- Toy Care, GIOletta Lamas, Neuro- Tat  Last pap- 2015 Mammo- between 3-5 yrs ago  Memory issues- pt reports she is 'forgetting everything'.  sxs started ~1 yr ago.  Has a neurologist.  Pt reports things have acutely worsened in the last month.  Had similar sxs in 2013/14 and had neuropsych testing that showed no evidence of neuro impairment.  No increased stress recently.  No recent medication changes.  Denies untreated anxiety or depression at this time.  Pt is tearful when talking about memory loss.  Had an episode where she was trying to get in the wrong car- different make/model/color and also did not remember getting a package delivered.  HTN- chronic problem, on HCTZ and Lisinopril daily w/ good control.  No CP, SOB, HAs, visual changes,  Hyperlipidemia- chronic problem, on Crestor daily.  Denies abd pain, N/V, myalgias.     Review of Systems For ROS see HPI     Objective:   Physical Exam  Constitutional: She is oriented to person, place, and time. She appears well-developed and well-nourished. No distress.  HENT:  Head: Normocephalic and atraumatic.  Eyes: Conjunctivae and EOM are normal. Pupils are equal, round, and reactive to light.  Neck: Normal range of motion. Neck supple. No thyromegaly present.  Cardiovascular: Normal rate, regular rhythm, normal heart sounds and intact distal pulses.   No murmur heard. Pulmonary/Chest: Effort normal and breath sounds normal. No respiratory distress.  Abdominal: Soft. She exhibits no distension. There is no tenderness.  Musculoskeletal: She exhibits no edema.  Lymphadenopathy:    She has no cervical adenopathy.  Neurological: She is alert and oriented to person, place, and time.  Skin: Skin is warm and dry.  Psychiatric: She has a normal mood and affect. Her  behavior is normal.  Vitals reviewed.         Assessment & Plan:

## 2016-11-24 NOTE — Progress Notes (Signed)
Pre visit review using our clinic review tool, if applicable. No additional management support is needed unless otherwise documented below in the visit note. 

## 2016-11-25 ENCOUNTER — Other Ambulatory Visit: Payer: Self-pay | Admitting: Family Medicine

## 2016-11-25 DIAGNOSIS — R7989 Other specified abnormal findings of blood chemistry: Secondary | ICD-10-CM

## 2016-11-30 ENCOUNTER — Other Ambulatory Visit (INDEPENDENT_AMBULATORY_CARE_PROVIDER_SITE_OTHER): Payer: 59

## 2016-11-30 DIAGNOSIS — R7989 Other specified abnormal findings of blood chemistry: Secondary | ICD-10-CM | POA: Diagnosis not present

## 2016-11-30 LAB — BASIC METABOLIC PANEL
BUN: 11 mg/dL (ref 6–23)
CALCIUM: 9.4 mg/dL (ref 8.4–10.5)
CO2: 29 mEq/L (ref 19–32)
Chloride: 94 mEq/L — ABNORMAL LOW (ref 96–112)
Creatinine, Ser: 0.74 mg/dL (ref 0.40–1.20)
GFR: 84.53 mL/min (ref >60.00)
Glucose, Bld: 102 mg/dL — ABNORMAL HIGH (ref 70–99)
Potassium: 4.1 mEq/L (ref 3.5–5.1)
SODIUM: 129 meq/L — AB (ref 135–145)

## 2016-12-01 NOTE — Progress Notes (Signed)
Called pt and lmovm to return call.

## 2016-12-07 ENCOUNTER — Ambulatory Visit (INDEPENDENT_AMBULATORY_CARE_PROVIDER_SITE_OTHER): Payer: Self-pay | Admitting: Podiatry

## 2016-12-07 DIAGNOSIS — M779 Enthesopathy, unspecified: Secondary | ICD-10-CM

## 2016-12-07 NOTE — Patient Instructions (Signed)

## 2016-12-16 MED FILL — ROSUVASTATIN CALCIUM 10 MG: 10 | 90 days supply | Qty: 90 | Fill #1
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
## 2016-12-22 ENCOUNTER — Encounter: Payer: Self-pay | Admitting: Family Medicine

## 2016-12-22 ENCOUNTER — Ambulatory Visit (INDEPENDENT_AMBULATORY_CARE_PROVIDER_SITE_OTHER): Payer: 59 | Admitting: Family Medicine

## 2016-12-22 VITALS — BP 128/80 | HR 61 | Temp 98.1°F | Resp 16 | Ht 63.0 in | Wt 149.4 lb

## 2016-12-22 DIAGNOSIS — I10 Essential (primary) hypertension: Secondary | ICD-10-CM

## 2016-12-22 LAB — BASIC METABOLIC PANEL
BUN: 13 mg/dL (ref 6–23)
CHLORIDE: 94 meq/L — AB (ref 96–112)
CO2: 31 mEq/L (ref 19–32)
Calcium: 9.3 mg/dL (ref 8.4–10.5)
Creatinine, Ser: 0.69 mg/dL (ref 0.40–1.20)
GFR: 91.62 mL/min (ref >60.00)
Glucose, Bld: 96 mg/dL (ref 70–99)
Potassium: 4.5 mEq/L (ref 3.5–5.1)
Sodium: 129 mEq/L — ABNORMAL LOW (ref 135–145)

## 2016-12-22 NOTE — Progress Notes (Signed)
   Subjective:    Patient ID: Audrey Baxter, female    DOB: 1956-03-26, 61 y.o.   MRN: [id number]  HPI HTN- chronic problem, on Lisinopril daily.  Stopped HCTZ last visit b/c Na was low (129).  BP remains well controlled.  Exercising regularly.  Denies CP, SOB, HAs, visual changes, edema.  Review of Systems For ROS see HPI     Objective:   Physical Exam  Constitutional: She is oriented to person, place, and time. She appears well-developed and well-nourished. No distress.  HENT:  Head: Normocephalic and atraumatic.  Eyes: Conjunctivae and EOM are normal. Pupils are equal, round, and reactive to light.  Neck: Normal range of motion. Neck supple. No thyromegaly present.  Cardiovascular: Normal rate, regular rhythm, normal heart sounds and intact distal pulses.   No murmur heard. Pulmonary/Chest: Effort normal and breath sounds normal. No respiratory distress.  Abdominal: Soft. She exhibits no distension. There is no tenderness.  Musculoskeletal: She exhibits no edema.  Lymphadenopathy:    She has no cervical adenopathy.  Neurological: She is alert and oriented to person, place, and time.  Skin: Skin is warm and dry.  Psychiatric: She has a normal mood and affect. Her behavior is normal.  Vitals reviewed.         Assessment & Plan:

## 2016-12-22 NOTE — Assessment & Plan Note (Signed)
Chronic problem.  Suspect this is due to her anxiety and generalized sense of being overwhelmed lately.  Has seen neuro and had neuropsych testing.  Explained pseudodementia to her and she seemed relieved.  Will follow along.

## 2016-12-22 NOTE — Assessment & Plan Note (Signed)
Chronic problem, on Lisinopril HCTZ w/ good control.  Asymptomatic.  Check labs.  No anticipated med changes.

## 2016-12-22 NOTE — Assessment & Plan Note (Signed)
New to provider, ongoing for pt.  Following w/ Dr Toy Care.  Will follow along.

## 2016-12-22 NOTE — Assessment & Plan Note (Signed)
Chronic problem.  Tolerating statin w/o difficulty.  Check labs.  Adjust meds prn  

## 2016-12-22 NOTE — Assessment & Plan Note (Signed)
Remains well controlled today despite stopping HCTZ.  Continue Losartan daily.  Check BMP.  Will continue to follow.

## 2016-12-22 NOTE — Progress Notes (Signed)
Pre visit review using our clinic review tool, if applicable. No additional management support is needed unless otherwise documented below in the visit note. 

## 2016-12-22 NOTE — Patient Instructions (Signed)
Schedule your complete physical in 6 months We'll notify you of your lab results and make any changes if needed Try and cut back on your soda intake Drink plenty of fluids Keep up the good work on healthy diet and regular exercise- you look great!!! Call with any questions or concerns Happy New Year!!!

## 2016-12-23 ENCOUNTER — Encounter: Payer: Self-pay | Admitting: Neurology

## 2016-12-23 ENCOUNTER — Other Ambulatory Visit (INDEPENDENT_AMBULATORY_CARE_PROVIDER_SITE_OTHER): Payer: 59

## 2016-12-23 ENCOUNTER — Encounter: Payer: Self-pay | Admitting: General Practice

## 2016-12-23 ENCOUNTER — Ambulatory Visit (INDEPENDENT_AMBULATORY_CARE_PROVIDER_SITE_OTHER): Payer: 59 | Admitting: Neurology

## 2016-12-23 VITALS — BP 132/78 | HR 70 | Ht 63.5 in | Wt 148.0 lb

## 2016-12-23 DIAGNOSIS — D649 Anemia, unspecified: Secondary | ICD-10-CM

## 2016-12-23 DIAGNOSIS — R29810 Facial weakness: Secondary | ICD-10-CM | POA: Diagnosis not present

## 2016-12-23 DIAGNOSIS — R413 Other amnesia: Secondary | ICD-10-CM

## 2016-12-23 DIAGNOSIS — G250 Essential tremor: Secondary | ICD-10-CM | POA: Diagnosis not present

## 2016-12-23 LAB — VITAMIN B12: Vitamin B-12: 271 pg/mL (ref 211–911)

## 2016-12-23 NOTE — Patient Instructions (Addendum)
1. Your provider has requested that you have labwork completed today. Please go to [facility] ([id number]) on the second floor of this building before leaving the office today. You do not need to check in. If you are not called within 15 minutes please check with the front desk.   2. We have scheduled you at [facility] for your MRI on 12/30/16 at 9:00 am. Please arrive 15 minutes prior and go to 1st floor radiology. If you need to reschedule for any reason please call [phone].

## 2016-12-26 ENCOUNTER — Telehealth: Payer: Self-pay | Admitting: Neurology

## 2016-12-26 NOTE — Telephone Encounter (Signed)
Left message on machine for patient to call back.

## 2016-12-26 NOTE — Telephone Encounter (Signed)
-----   Message from [place], DO sent at 12/26/2016  8:41 AM EST ----- Let pt know that her B12 level is low.  Would like to see over 400.  Would recommend oral supplement 1018mcg daily and we can recheck in future to make sure that it is rising

## 2016-12-26 NOTE — Telephone Encounter (Signed)
Patient made aware and will start supplements.  

## 2016-12-30 ENCOUNTER — Ambulatory Visit (HOSPITAL_COMMUNITY): Payer: 59

## 2016-12-30 NOTE — Progress Notes (Signed)
Patient presents for orthotic pick up.  Verbal and written break in and wear instructions given.  Patient will follow up in 4 weeks if symptoms worsen or fail to improve. 

## 2017-01-03 ENCOUNTER — Ambulatory Visit (HOSPITAL_COMMUNITY): Payer: 59

## 2017-01-12 MED FILL — BUPROPION HCL XL 300 MG TAB: 300 | 90 days supply | Qty: 90 | Fill #1

## 2017-01-12 MED FILL — DESVENLAFAXINE SUC ER 100 M: 100 | 90 days supply | Qty: 90 | Fill #1

## 2017-01-16 MED FILL — LISINOPRIL 20 MG TABLET: 20 | 90 days supply | Qty: 90 | Fill #1

## 2017-01-18 ENCOUNTER — Ambulatory Visit
Admission: RE | Admit: 2017-01-18 | Discharge: 2017-01-18 | Disposition: A | Discharge status: Home / Self Care | Payer: 59 | Source: Ambulatory Visit | Attending: Family Medicine | Admitting: Family Medicine

## 2017-01-18 DIAGNOSIS — Z1231 Encounter for screening mammogram for malignant neoplasm of breast: Secondary | ICD-10-CM

## 2017-01-30 ENCOUNTER — Other Ambulatory Visit (HOSPITAL_COMMUNITY): Payer: 59

## 2017-02-02 ENCOUNTER — Encounter: Payer: 59 | Admitting: Psychology

## 2017-02-06 ENCOUNTER — Other Ambulatory Visit: Payer: Self-pay | Admitting: Neurology

## 2017-02-06 MED FILL — PRIMIDONE 50 MG TABLET: 50 | 90 days supply | Qty: 450 | Fill #0

## 2017-02-17 ENCOUNTER — Ambulatory Visit (INDEPENDENT_AMBULATORY_CARE_PROVIDER_SITE_OTHER): Payer: 59

## 2017-02-17 ENCOUNTER — Encounter: Payer: Self-pay | Admitting: Podiatry

## 2017-02-17 ENCOUNTER — Ambulatory Visit (INDEPENDENT_AMBULATORY_CARE_PROVIDER_SITE_OTHER): Payer: 59 | Admitting: Podiatry

## 2017-02-17 VITALS — BP 112/66 | HR 83 | Resp 16

## 2017-02-17 DIAGNOSIS — L812 Freckles: Secondary | ICD-10-CM | POA: Diagnosis not present

## 2017-02-17 DIAGNOSIS — M79671 Pain in right foot: Secondary | ICD-10-CM | POA: Diagnosis not present

## 2017-02-17 DIAGNOSIS — M779 Enthesopathy, unspecified: Secondary | ICD-10-CM

## 2017-02-17 DIAGNOSIS — D225 Melanocytic nevi of trunk: Secondary | ICD-10-CM | POA: Diagnosis not present

## 2017-02-17 DIAGNOSIS — L821 Other seborrheic keratosis: Secondary | ICD-10-CM | POA: Diagnosis not present

## 2017-02-17 DIAGNOSIS — D2261 Melanocytic nevi of right upper limb, including shoulder: Secondary | ICD-10-CM | POA: Diagnosis not present

## 2017-02-17 DIAGNOSIS — M79672 Pain in left foot: Secondary | ICD-10-CM

## 2017-02-17 DIAGNOSIS — L718 Other rosacea: Secondary | ICD-10-CM | POA: Diagnosis not present

## 2017-02-17 DIAGNOSIS — M7750 Other enthesopathy of unspecified foot: Secondary | ICD-10-CM

## 2017-02-17 DIAGNOSIS — D2262 Melanocytic nevi of left upper limb, including shoulder: Secondary | ICD-10-CM | POA: Diagnosis not present

## 2017-02-17 DIAGNOSIS — D1801 Hemangioma of skin and subcutaneous tissue: Secondary | ICD-10-CM | POA: Diagnosis not present

## 2017-02-17 DIAGNOSIS — D485 Neoplasm of uncertain behavior of skin: Secondary | ICD-10-CM | POA: Diagnosis not present

## 2017-02-17 DIAGNOSIS — L570 Actinic keratosis: Secondary | ICD-10-CM | POA: Diagnosis not present

## 2017-02-17 MED ORDER — TRIAMCINOLONE ACETONIDE 10 MG/ML IJ SUSP
10.0000 mg | Freq: Once | INTRAMUSCULAR | Status: AC
  Administered 2017-02-17: 10 mg

## 2017-02-19 NOTE — Progress Notes (Signed)
Subjective:     Patient ID: Audrey Baxter, female   DOB: 05/15/1956, 61 y.o.   MRN: [id number]  HPI patient presents stating she's developed a lot of pain on the outside of both feet and it's increasingly hard for her to walk comfortably   Review of Systems     Objective:   Physical Exam Neurovascular status unchanged with inflammation fluid around the fifth metatarsal base bilateral that's painful when palpated    Assessment:     Appears to be peroneal tendinitis at the insertion into the base of fifth metatarsal bilateral    Plan:     H&P x-rays reviewed condition discussed and at this point careful sheath injections administered of the base of the fifth metatarsal bilateral 3 mg Kenalog 5 mg Xylocaine. I discussed ice therapy I discussed shoe gear modifications and if symptoms persist we may need to consider resection of bone. Reappoint for [place] to recheck again in the next several weeks  X-ray report indicated that there is no indications of tearing of the tendon or of bony encroachment on the sides of the fifth metatarsal

## 2017-02-22 MED FILL — MIRVASO 0.33% GEL PUMP: 0.33 | 30 days supply | Qty: 30 | Fill #0

## 2017-03-20 ENCOUNTER — Telehealth: Payer: Self-pay | Admitting: Podiatry

## 2017-03-20 NOTE — Telephone Encounter (Signed)
Pt calling to check on status of orthotics.She was scanned on 3.2.18

## 2017-03-21 ENCOUNTER — Ambulatory Visit (INDEPENDENT_AMBULATORY_CARE_PROVIDER_SITE_OTHER): Payer: 59 | Admitting: Physician Assistant

## 2017-03-21 ENCOUNTER — Encounter: Payer: Self-pay | Admitting: Physician Assistant

## 2017-03-21 VITALS — BP 110/68 | HR 75 | Temp 98.0°F | Resp 14 | Ht 64.0 in | Wt 146.0 lb

## 2017-03-21 DIAGNOSIS — K529 Noninfective gastroenteritis and colitis, unspecified: Secondary | ICD-10-CM

## 2017-03-21 DIAGNOSIS — R152 Fecal urgency: Secondary | ICD-10-CM

## 2017-03-21 NOTE — Progress Notes (Signed)
Patient presents to clinic today c/o 2 years of loose stools with meals. Endorses within minutes of finishing a meal, noting abdominal cramping followed by urgency of defecation. Denies tenesmus, hematochezia or tenesmus. Endorses urgency markedly improves for the most part. Sometimes has to go again in 10-15 minutes. Afterwards noting some mild abdominal cramping but quickly resolves. Denies nausea, vomiting, heart burn, reflux or globus sensation. Endorses peanuts are a trigger but cannot find any other trigger foods. States this happens regardless of what she eats. Patient with recent screening colonoscopy -- negative except for a adenoma. Mother and brother's have also suffered from similar issues, taking Lomotil on a chronic basis. Patient with significant history of anxiety/depression on multiple medication. Denies stress as a trigger.   Past Medical History:  Diagnosis Date  . Anxiety   . Cancer ([place])    cervical/melanoma  . Depression   . GERD (gastroesophageal reflux disease)   . History of colon polyps   . Hyperlipidemia   . Hypertension   . Osteoporosis   . Tremor     Current Outpatient Prescriptions on File Prior to Visit  Medication Sig Dispense Refill  . ARIPiprazole (ABILIFY) 5 MG tablet Take 5 mg by mouth once.     Marland Kitchen buPROPion (WELLBUTRIN XL) 300 MG 24 hr tablet Take 300 mg by mouth daily.    . cholecalciferol (VITAMIN D) 1000 units tablet Take 1,000 Units by mouth daily.    Marland Kitchen desvenlafaxine (PRISTIQ) 100 MG 24 hr tablet Take 100 mg by mouth daily.    Marland Kitchen lisinopril (PRINIVIL,ZESTRIL) 20 MG tablet Take 20 mg by mouth daily.    . Omega-3 Fatty Acids (FISH OIL) 1200 MG CAPS Take by mouth.    . primidone (MYSOLINE) 50 MG tablet TAKE 2 TABLETS BY MOUTH IN THE MORNING, 2 TABLETS IN THE AFTERNOON AND 1 TABLET IN THE EVENING AS DIRECTED 450 tablet 1  . rosuvastatin (CRESTOR) 10 MG tablet Take 10 mg by mouth daily.     No current facility-administered medications on file prior to  visit.     Allergies  Allergen Reactions  . Atorvastatin Other (See Comments)  . Cephalexin   . Erythromycin Base Other (See Comments)  . Pyridium [Phenazopyridine Hcl]   . Simvastatin Other (See Comments)    Other reaction(s): muscle aches    Family History  Problem Relation Age of Onset  . COPD Mother   . Cancer Father     lung, bladder  . Alcohol abuse Father   . HIV/AIDS Brother   . Cancer Maternal Aunt     breast  . Alcohol abuse Paternal Aunt   . Stroke Maternal Grandmother   . Heart attack Maternal Grandfather   . Cancer Paternal Grandfather     lung/spine  . Cancer Brother     lung    Social History   Social History  . Marital status: Married    Spouse name: N/A  . Number of children: N/A  . Years of education: N/A   Occupational History  . chaplain     [employer]   Social History Main Topics  . Smoking status: Former Smoker    Quit date: 12/19/1978  . Smokeless tobacco: Never Used  . Alcohol use 0.0 oz/week     Comment: glass wine every other night  . Drug use: No  . Sexual activity: Yes   Other Topics Concern  . None   Social History Narrative  . None   Review of Systems - See  HPI.  All other ROS are negative.  There were no vitals taken for this visit.  Physical Exam  Constitutional: She is oriented to person, place, and time and well-developed, well-nourished, and in no distress.  HENT:  Head: Normocephalic and atraumatic.  Eyes: Conjunctivae are normal.  Neck: Neck supple. No thyromegaly present.  Cardiovascular: Normal rate, regular rhythm, normal heart sounds and intact distal pulses.   Pulmonary/Chest: Effort normal and breath sounds normal. No respiratory distress. She has no wheezes. She has no rales. She exhibits no tenderness.  Abdominal: Soft. Bowel sounds are normal. She exhibits no distension and no mass. There is no tenderness. There is no rebound and no guarding.  Neurological: She is alert and oriented to person, place, and  time.  Skin: Skin is warm and dry. No rash noted.  Psychiatric: Affect normal.  Vitals reviewed.  Recent Results (from the past 2160 hour(s))  Basic metabolic panel     Status: Abnormal   Collection Time: 12/22/16  9:19 AM  Result Value Ref Range   Sodium 129 (L) 135 - 145 mEq/L   Potassium 4.5 3.5 - 5.1 mEq/L   Chloride 94 (L) 96 - 112 mEq/L   CO2 31 19 - 32 mEq/L   Glucose, Bld 96 70 - 99 mg/dL   BUN 13 6 - 23 mg/dL   Creatinine, Ser 0.69 0.40 - 1.20 mg/dL   Calcium 9.3 8.4 - 10.5 mg/dL   GFR 91.62 >60.00 mL/min  Vitamin B12     Status: None   Collection Time: 12/23/16  2:38 PM  Result Value Ref Range   Vitamin B-12 271 211 - 911 pg/mL    Assessment/Plan: 1. Fecal urgency 2. Chronic diarrhea Long-standing with gradual worsening. Denies incontinence. Exam unremarkable. IBS likely but other diagnoses need to be excluded. Start fiber supplement. Dietary recommendations given. She is to start a food/BM diary. Start probiotic. Referral to GI for further assessment and management. If workup unremarkable and diagnosis of IBS made, [facility] would be a good option.  - Ambulatory referral to Gastroenterology   Leeanne Rio, PA-C

## 2017-03-21 NOTE — Progress Notes (Signed)
Pre visit review using our clinic review tool, if applicable. No additional management support is needed unless otherwise documented below in the visit note. 

## 2017-03-21 NOTE — Patient Instructions (Addendum)
Please start daily benefiber fiber supplement. Start a food/stool journal so we can see if there is a pattern present here.  Can use immodium if needed for severe loose stool.  I am setting you up with Gastroenterology for further assessment of these chronic issues. Long-st

## 2017-03-22 ENCOUNTER — Other Ambulatory Visit: Payer: Self-pay | Admitting: Emergency Medicine

## 2017-03-22 MED ORDER — ROSUVASTATIN CALCIUM 10 MG PO TABS: 10.0000 mg | ORAL_TABLET | Freq: Every day | ORAL | 1 refills | Status: DC

## 2017-03-22 MED FILL — ROSUVASTATIN CALCIUM 10 MG: 10 | 90 days supply | Qty: 90 | Fill #0

## 2017-03-30 DIAGNOSIS — Z86010 Personal history of colonic polyps: Secondary | ICD-10-CM | POA: Diagnosis not present

## 2017-03-30 DIAGNOSIS — K5900 Constipation, unspecified: Secondary | ICD-10-CM | POA: Diagnosis not present

## 2017-04-06 ENCOUNTER — Telehealth: Payer: Self-pay | Admitting: Podiatry

## 2017-04-06 NOTE — Telephone Encounter (Signed)
Left message for patient that her orthotics are ready for pick up. These are a 2nd pair.

## 2017-04-13 MED FILL — BUPROPION HCL XL 300 MG TAB: 300 | 90 days supply | Qty: 90 | Fill #2

## 2017-04-13 MED FILL — DESVENLAFAXINE SUC ER 100 M: 100 | 90 days supply | Qty: 90 | Fill #2

## 2017-04-13 MED FILL — ARIPiprazole 10 MG TABS: 10 | 90 days supply | Qty: 90 | Fill #1

## 2017-04-17 ENCOUNTER — Encounter: Payer: Self-pay | Admitting: Family Medicine

## 2017-04-17 ENCOUNTER — Ambulatory Visit (INDEPENDENT_AMBULATORY_CARE_PROVIDER_SITE_OTHER): Payer: 59 | Admitting: Family Medicine

## 2017-04-17 VITALS — BP 121/72 | HR 61 | Temp 98.4°F | Resp 17 | Ht 63.0 in | Wt 149.1 lb

## 2017-04-17 DIAGNOSIS — R05 Cough: Secondary | ICD-10-CM

## 2017-04-17 DIAGNOSIS — R059 Cough, unspecified: Secondary | ICD-10-CM

## 2017-04-17 MED ORDER — ALBUTEROL SULFATE (2.5 MG/3ML) 0.083% IN NEBU
2.5000 mg | INHALATION_SOLUTION | Freq: Once | RESPIRATORY_TRACT | Status: AC
  Administered 2017-04-17: 2.5 mg via RESPIRATORY_TRACT

## 2017-04-17 MED ORDER — ALBUTEROL SULFATE HFA 108 (90 BASE) MCG/ACT IN AERS: 2.0000 | INHALATION_SPRAY | Freq: Four times a day (QID) | RESPIRATORY_TRACT | 2 refills | Status: DC | PRN

## 2017-04-17 MED FILL — VENTOLIN HFA 90 MCG INHALER: 108 (90 BAS | 25 days supply | Qty: 18 | Fill #0

## 2017-04-17 NOTE — Progress Notes (Signed)
   Subjective:    Patient ID: Audrey Baxter, female    DOB: 23-Aug-1956, 61 y.o.   MRN: [id number]  HPI Cough- sxs started ~10 days ago.  + chest congestion.  No fever.  Denies sinus pain/pressure.  No ear pain.  No sore throat.  Cough is intermittently productive of clear sputum.  Taking OTC Mucinex w/o relief.  Pt reports 'this happens every spring'.    Review of Systems For ROS see HPI     Objective:   Physical Exam  Constitutional: She is oriented to person, place, and time. She appears well-developed and well-nourished. No distress.  HENT:  Head: Normocephalic and atraumatic.  Right Ear: Tympanic membrane normal.  Left Ear: Tympanic membrane normal.  Nose: Mucosal edema and rhinorrhea present. Right sinus exhibits no maxillary sinus tenderness and no frontal sinus tenderness. Left sinus exhibits no maxillary sinus tenderness and no frontal sinus tenderness.  Mouth/Throat: Mucous membranes are normal. Posterior oropharyngeal erythema (w/ PND) present.  Eyes: Conjunctivae and EOM are normal. Pupils are equal, round, and reactive to light.  Neck: Normal range of motion. Neck supple.  Cardiovascular: Normal rate, regular rhythm and normal heart sounds.   Pulmonary/Chest: Effort normal and breath sounds normal. No respiratory distress. She has no wheezes. She has no rales.  Dry cough  Lymphadenopathy:    She has no cervical adenopathy.  Neurological: She is alert and oriented to person, place, and time.  Skin: Skin is warm and dry.  Vitals reviewed.         Assessment & Plan:  Cough- new.  Pt's sxs started ~10 days ago.  No evidence of bacterial infxn on exam.  Pt's biggest concern is chest tightness while coughing or taking a deep breath.  Chest tightness improved s/p neb tx in office.  Encouraged her to start daily antihistamine for allergy sxs until feeling better.  Albuterol inhaler prescribed for home use prn.  Reviewed supportive care and red flags that should prompt  return.  Pt expressed understanding and is in agreement w/ plan.

## 2017-04-17 NOTE — Progress Notes (Signed)
Pre visit review using our clinic review tool, if applicable. No additional management support is needed unless otherwise documented below in the visit note. 

## 2017-04-17 NOTE — Patient Instructions (Signed)
Follow up as needed/scheduled Start daily Claritin or Zyrtec until feeling better Drink plenty of fluids Continue the Mucinex DM for cough/congestion Use the Albuterol inhaler- 2 puffs every 4 hrs as needed for cough/chest congestion Call with any questions or concerns Hang in there!!!

## 2017-04-21 MED FILL — LISINOPRIL 20 MG TAB: 20 | 90 days supply | Qty: 90 | Fill #2

## 2017-05-26 ENCOUNTER — Ambulatory Visit (INDEPENDENT_AMBULATORY_CARE_PROVIDER_SITE_OTHER): Payer: 59 | Admitting: Family Medicine

## 2017-05-26 ENCOUNTER — Encounter: Payer: Self-pay | Admitting: Family Medicine

## 2017-05-26 VITALS — BP 118/74 | HR 69 | Temp 98.0°F | Resp 16 | Ht 63.0 in | Wt 145.0 lb

## 2017-05-26 DIAGNOSIS — Z0000 Encounter for general adult medical examination without abnormal findings: Secondary | ICD-10-CM | POA: Insufficient documentation

## 2017-05-26 DIAGNOSIS — I10 Essential (primary) hypertension: Secondary | ICD-10-CM | POA: Diagnosis not present

## 2017-05-26 LAB — HEPATIC FUNCTION PANEL
ALBUMIN: 4.6 g/dL (ref 3.5–5.2)
ALT: 15 U/L (ref 0–35)
AST: 16 U/L (ref 0–37)
Alkaline Phosphatase: 82 U/L (ref 39–117)
Bilirubin, Direct: 0.1 mg/dL (ref 0.0–0.3)
TOTAL PROTEIN: 6.9 g/dL (ref 6.0–8.3)
Total Bilirubin: 0.3 mg/dL (ref 0.2–1.2)

## 2017-05-26 LAB — BASIC METABOLIC PANEL
BUN: 8 mg/dL (ref 6–23)
CALCIUM: 10 mg/dL (ref 8.4–10.5)
CO2: 31 mEq/L (ref 19–32)
CREATININE: 0.76 mg/dL (ref 0.40–1.20)
Chloride: 102 mEq/L (ref 96–112)
GFR: 81.84 mL/min (ref >60.00)
GLUCOSE: 99 mg/dL (ref 70–99)
Potassium: 4.5 mEq/L (ref 3.5–5.1)
Sodium: 138 mEq/L (ref 135–145)

## 2017-05-26 LAB — LIPID PANEL
CHOLESTEROL: 180 mg/dL (ref 0–200)
HDL: 78.9 mg/dL (ref >39.00)
LDL Cholesterol: 85 mg/dL (ref 0–99)
NONHDL: 100.88
TRIGLYCERIDES: 79 mg/dL (ref 0.0–149.0)
Total CHOL/HDL Ratio: 2
VLDL: 15.8 mg/dL (ref 0.0–40.0)

## 2017-05-26 LAB — CBC WITH DIFFERENTIAL/PLATELET
BASOS ABS: 0.1 10*3/uL (ref 0.0–0.1)
Basophils Relative: 1.2 % (ref 0.0–3.0)
EOS ABS: 0.2 10*3/uL (ref 0.0–0.7)
Eosinophils Relative: 3.2 % (ref 0.0–5.0)
HEMATOCRIT: 36.3 % (ref 36.0–46.0)
HEMOGLOBIN: 12.4 g/dL (ref 12.0–15.0)
LYMPHS PCT: 32.7 % (ref 12.0–46.0)
Lymphs Abs: 1.6 10*3/uL (ref 0.7–4.0)
MCHC: 34.2 g/dL (ref 30.0–36.0)
MCV: 91.3 fl (ref 78.0–100.0)
Monocytes Absolute: 0.4 10*3/uL (ref 0.1–1.0)
Monocytes Relative: 8.3 % (ref 3.0–12.0)
NEUTROS ABS: 2.6 10*3/uL (ref 1.4–7.7)
Neutrophils Relative %: 54.6 % (ref 43.0–77.0)
PLATELETS: 308 10*3/uL (ref 150.0–400.0)
RBC: 3.97 Mil/uL (ref 3.87–5.11)
RDW: 12.9 % (ref 11.5–15.5)
WBC: 4.8 10*3/uL (ref 4.0–10.5)

## 2017-05-26 LAB — VITAMIN D 25 HYDROXY (VIT D DEFICIENCY, FRACTURES): VITD: 23.03 ng/mL — ABNORMAL LOW (ref 30.00–100.00)

## 2017-05-26 LAB — TSH: TSH: 1.24 u[IU]/mL (ref 0.35–4.50)

## 2017-05-26 MED ORDER — LISINOPRIL 10 MG PO TABS: 10.0000 mg | ORAL_TABLET | Freq: Every day | ORAL | 3 refills | Status: DC

## 2017-05-26 MED FILL — LISINOPRIL 10 MG TABLET: 10 | 30 days supply | Qty: 30 | Fill #0

## 2017-05-26 NOTE — Progress Notes (Signed)
Pre visit review using our clinic review tool, if applicable. No additional management support is needed unless otherwise documented below in the visit note. 

## 2017-05-26 NOTE — Assessment & Plan Note (Signed)
Pt's PE WNL.  UTD on mammo, colonoscopy.  Wants to do pap at next visit.  Check labs.  Anticipatory guidance provided.

## 2017-05-26 NOTE — Progress Notes (Signed)
   Subjective:    Patient ID: Audrey Baxter, female    DOB: [birth year], 61 y.o.   MRN: [id number]  HPI CPE- UTD on mammo, colonoscopy, immunizations.  Due for pap but pt declines at this time- prefers to reschedule.  Pt is interested in weaning off BP meds if possible.  Currently on Lisinopril 20mg  daily.   Review of Systems Patient reports no vision/ hearing changes, adenopathy,fever, weight change,  persistant/recurrent hoarseness , swallowing issues, chest pain, palpitations, edema, persistant/recurrent cough, hemoptysis, dyspnea (rest/exertional/paroxysmal nocturnal), gastrointestinal bleeding (melena, rectal bleeding), abdominal pain, significant heartburn, bowel changes, GU symptoms (dysuria, hematuria, incontinence), Gyn symptoms (abnormal  bleeding, pain),  syncope, focal weakness, memory loss, numbness & tingling, skin/hair/nail changes, abnormal bruising or bleeding, anxiety, or depression.     Objective:   Physical Exam General Appearance:    Alert, cooperative, no distress, appears stated age  Head:    Normocephalic, without obvious abnormality, atraumatic  Eyes:    PERRL, conjunctiva/corneas clear, EOM's intact, fundi    benign, both eyes  Ears:    Normal TM's and external ear canals, both ears  Nose:   Nares normal, septum midline, mucosa normal, no drainage    or sinus tenderness  Throat:   Lips, mucosa, and tongue normal; teeth and gums normal  Neck:   Supple, symmetrical, trachea midline, no adenopathy;    Thyroid: no enlargement/tenderness/nodules  Back:     Symmetric, no curvature, ROM normal, no CVA tenderness  Lungs:     Clear to auscultation bilaterally, respirations unlabored  Chest Wall:    No tenderness or deformity   Heart:    Regular rate and rhythm, S1 and S2 normal, no murmur, rub   or gallop  Breast Exam:    Deferred to mammo  Abdomen:     Soft, non-tender, bowel sounds active all four quadrants,    no masses, no organomegaly  Genitalia:    Deferred  at pt request  Rectal:    Extremities:   Extremities normal, atraumatic, no cyanosis or edema  Pulses:   2+ and symmetric all extremities  Skin:   Skin color, texture, turgor normal, no rashes or lesions  Lymph nodes:   Cervical, supraclavicular, and axillary nodes normal  Neurologic:   CNII-XII intact, normal strength, sensation and reflexes    throughout          Assessment & Plan:

## 2017-05-26 NOTE — Assessment & Plan Note (Signed)
BP is well controlled today.  Pt would like to wean meds if possible.  Will decrease Lisinopril to 10mg  daily and have her f/u in 3 months.  Pt expressed understanding and is in agreement w/ plan.

## 2017-05-26 NOTE — Patient Instructions (Signed)
Follow up in 3 months to recheck BP and do your pap We'll notify you of your lab results and make any changes if needed Decrease the Lisinopril to 10mg  daily Continue to work on healthy diet and regular exercise- you look great! Call with any questions or concerns Have a great summer!!!

## 2017-05-29 ENCOUNTER — Other Ambulatory Visit: Payer: Self-pay | Admitting: General Practice

## 2017-05-29 MED ORDER — VITAMIN D (ERGOCALCIFEROL) 1.25 MG (50000 UNIT) PO CAPS: 50000.0000 [IU] | ORAL_CAPSULE | ORAL | 0 refills | Status: DC

## 2017-05-29 MED FILL — VIT D2 1.25 MG (50,000 UNIT: 1.25 MG | 84 days supply | Qty: 12 | Fill #0

## 2017-06-08 MED FILL — PRIMIDONE 50 MG TABLET: 50 | 90 days supply | Qty: 450 | Fill #1

## 2017-06-25 MED FILL — LISINOPRIL 10 MG TABLET: 10 | 30 days supply | Qty: 30 | Fill #1

## 2017-07-14 MED FILL — ROSUVASTATIN CALCIUM 10 MG: 10 | 90 days supply | Qty: 90 | Fill #1

## 2017-07-14 MED FILL — DESVENLAFAXINE SUC ER 100 M: 100 | 90 days supply | Qty: 90 | Fill #0

## 2017-07-17 MED FILL — buPROPion HCL ER (XL) 300 M: 300 | 90 days supply | Qty: 90 | Fill #0

## 2017-07-18 DIAGNOSIS — L820 Inflamed seborrheic keratosis: Secondary | ICD-10-CM | POA: Diagnosis not present

## 2017-07-18 DIAGNOSIS — L821 Other seborrheic keratosis: Secondary | ICD-10-CM | POA: Diagnosis not present

## 2017-07-26 MED FILL — LISINOPRIL 10 MG TABLET: 10 | 30 days supply | Qty: 30 | Fill #2

## 2017-08-01 ENCOUNTER — Ambulatory Visit (INDEPENDENT_AMBULATORY_CARE_PROVIDER_SITE_OTHER): Payer: 59 | Admitting: Family Medicine

## 2017-08-01 ENCOUNTER — Encounter: Payer: Self-pay | Admitting: Family Medicine

## 2017-08-01 VITALS — BP 120/76 | HR 76 | Temp 98.1°F | Resp 17 | Ht 63.0 in | Wt 147.2 lb

## 2017-08-01 DIAGNOSIS — R1310 Dysphagia, unspecified: Secondary | ICD-10-CM | POA: Diagnosis not present

## 2017-08-01 MED ORDER — PANTOPRAZOLE SODIUM 40 MG PO TBEC: 40.0000 mg | DELAYED_RELEASE_TABLET | Freq: Every day | ORAL | 3 refills | Status: DC

## 2017-08-01 MED FILL — PANTOPRAZOLE SOD DR 40 MG T: 40 | 30 days supply | Qty: 30 | Fill #0

## 2017-08-01 NOTE — Progress Notes (Signed)
   Subjective:    Patient ID: Audrey Baxter, female    DOB: [birth year], 61 y.o.   MRN: [id number]  HPI Dysphagia- 'this lump in my throat that doesn't seem to go away.  It makes swallowing painful and I feel a gurgling in my throat'.  Hx of similar.  Referred to ENT previously.  Pt has strong fear of throat cancer due to hx of 'heavy smoking'.  Pt reports that swallowing causes pain from sternal notch to mid sternum.  Not currently on GERD meds.  Denies seasonal allergies 'but I do have a constant nasal drip'.   Review of Systems For ROS see HPI     Objective:   Physical Exam  Constitutional: She is oriented to person, place, and time. She appears well-developed and well-nourished. No distress.  HENT:  Head: Normocephalic and atraumatic.  Mouth/Throat: Oropharynx is clear and moist. No oropharyngeal exudate.  Neurological: She is alert and oriented to person, place, and time.  Skin: Skin is warm and dry.  Psychiatric: She has a normal mood and affect. Her behavior is normal. Thought content normal.  Vitals reviewed.         Assessment & Plan:  Dysphagia- new.  Given the description of her sxs it is most consistent w/ silent GERD (which she has a hx of).  Start PPI.  Reviewed lifestyle and dietary modifications.  If no improvement, will need referral to GI.  Pt expressed understanding and is in agreement w/ plan.

## 2017-08-01 NOTE — Progress Notes (Signed)
Pre visit review using our clinic review tool, if applicable. No additional management support is needed unless otherwise documented below in the visit note. 

## 2017-08-01 NOTE — Patient Instructions (Signed)
Follow up as scheduled to recheck trouble swallowing Start the Protonix once daily to decrease acid production and silent reflux Continue to drink plenty of fluids Call with any questions or concerns Hang in there!!!

## 2017-08-02 ENCOUNTER — Encounter (HOSPITAL_COMMUNITY): Payer: Self-pay

## 2017-08-02 ENCOUNTER — Emergency Department (HOSPITAL_COMMUNITY): Payer: 59

## 2017-08-02 ENCOUNTER — Emergency Department (HOSPITAL_COMMUNITY)
Admission: EM | Admit: 2017-08-02 | Discharge: 2017-08-02 | Disposition: A | Discharge status: Home / Self Care | Payer: 59 | Attending: Emergency Medicine | Admitting: Emergency Medicine

## 2017-08-02 DIAGNOSIS — Z85820 Personal history of malignant melanoma of skin: Secondary | ICD-10-CM | POA: Insufficient documentation

## 2017-08-02 DIAGNOSIS — S6991XA Unspecified injury of right wrist, hand and finger(s), initial encounter: Secondary | ICD-10-CM | POA: Diagnosis present

## 2017-08-02 DIAGNOSIS — Z79899 Other long term (current) drug therapy: Secondary | ICD-10-CM | POA: Insufficient documentation

## 2017-08-02 DIAGNOSIS — Y9355 Activity, bike riding: Secondary | ICD-10-CM | POA: Diagnosis not present

## 2017-08-02 DIAGNOSIS — S52691A Other fracture of lower end of right ulna, initial encounter for closed fracture: Secondary | ICD-10-CM | POA: Diagnosis not present

## 2017-08-02 DIAGNOSIS — Y929 Unspecified place or not applicable: Secondary | ICD-10-CM | POA: Insufficient documentation

## 2017-08-02 DIAGNOSIS — S52571A Other intraarticular fracture of lower end of right radius, initial encounter for closed fracture: Secondary | ICD-10-CM | POA: Insufficient documentation

## 2017-08-02 DIAGNOSIS — Y998 Other external cause status: Secondary | ICD-10-CM | POA: Diagnosis not present

## 2017-08-02 DIAGNOSIS — I10 Essential (primary) hypertension: Secondary | ICD-10-CM | POA: Diagnosis not present

## 2017-08-02 DIAGNOSIS — S62101A Fracture of unspecified carpal bone, right wrist, initial encounter for closed fracture: Secondary | ICD-10-CM | POA: Diagnosis not present

## 2017-08-02 DIAGNOSIS — R0781 Pleurodynia: Secondary | ICD-10-CM | POA: Diagnosis not present

## 2017-08-02 DIAGNOSIS — Z87891 Personal history of nicotine dependence: Secondary | ICD-10-CM | POA: Diagnosis not present

## 2017-08-02 DIAGNOSIS — M25531 Pain in right wrist: Secondary | ICD-10-CM | POA: Diagnosis not present

## 2017-08-02 DIAGNOSIS — S299XXA Unspecified injury of thorax, initial encounter: Secondary | ICD-10-CM | POA: Diagnosis not present

## 2017-08-02 MED ORDER — NAPROXEN 250 MG PO TABS: 250.0000 mg | ORAL_TABLET | Freq: Two times a day (BID) | ORAL | 0 refills | Status: DC

## 2017-08-02 MED ORDER — OXYCODONE-ACETAMINOPHEN 5-325 MG PO TABS: 1.0000 | ORAL_TABLET | Freq: Four times a day (QID) | ORAL | 0 refills | Status: DC | PRN

## 2017-08-02 MED ORDER — OXYCODONE-ACETAMINOPHEN 5-325 MG PO TABS
1.0000 | ORAL_TABLET | ORAL | Status: DC | PRN
  Administered 2017-08-02: 1 via ORAL

## 2017-08-02 MED ORDER — OXYCODONE-ACETAMINOPHEN 5-325 MG PO TABS
ORAL_TABLET | ORAL | Status: AC
  Filled 2017-08-02: qty 1

## 2017-08-02 MED ORDER — OXYCODONE-ACETAMINOPHEN 5-325 MG PO TABS
1.0000 | ORAL_TABLET | Freq: Once | ORAL | Status: AC
  Administered 2017-08-02: 1 via ORAL
  Filled 2017-08-02: qty 1
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
## 2017-08-02 NOTE — Progress Notes (Signed)
Orthopedic Tech Progress Note Patient Details:  Audrey Baxter 1956-09-02 [id number]  Ortho Devices Type of Ortho Device: Short arm splint, Arm sling Ortho Device/Splint Location: applied short arm splint to pt right arm/wrist.  provided arm sling to pt right arm shoulder to help support wrist.  pt tolerated application very well.  pt husband at bedside.   Ortho Device/Splint Interventions: Application, Adjustment   Kristopher Oppenheim 08/02/2017, 8:24 PM

## 2017-08-04 DIAGNOSIS — S52571A Other intraarticular fracture of lower end of right radius, initial encounter for closed fracture: Secondary | ICD-10-CM | POA: Diagnosis not present

## 2017-08-08 MED FILL — clonazePAM 0.5 MG TABS: 0.5 | 30 days supply | Qty: 90 | Fill #0

## 2017-08-14 DIAGNOSIS — S52571D Other intraarticular fracture of lower end of right radius, subsequent encounter for closed fracture with routine healing: Secondary | ICD-10-CM | POA: Diagnosis not present

## 2017-08-16 ENCOUNTER — Ambulatory Visit: Admit: 2017-08-16 | Payer: 59 | Admitting: Orthopedic Surgery

## 2017-08-16 DIAGNOSIS — S52571A Other intraarticular fracture of lower end of right radius, initial encounter for closed fracture: Secondary | ICD-10-CM | POA: Diagnosis not present

## 2017-08-16 SURGERY — OPEN REDUCTION INTERNAL FIXATION (ORIF) DISTAL RADIUS FRACTURE
Anesthesia: Choice | Laterality: Right

## 2017-08-17 ENCOUNTER — Ambulatory Visit: Payer: Self-pay | Admitting: Orthopedic Surgery

## 2017-08-17 NOTE — Pre-Procedure Instructions (Signed)
    Audrey Baxter  08/17/2017      [place], [place] - [address] [facility] [address] [place] [address] Phone: [phone] Fax: [place], [place] - [place] [address] Phone: [phone] Fax: [phone]    Your procedure is scheduled on   Saturday  08/19/17  Report to [facility] Admitting at 530 A.M.  Call this number if you have problems the morning of surgery:  [phone]   Remember:  Do not eat food or drink liquids after midnight.  Take these medicines the morning of surgery with A SIP OF WATER   ALBUTEROL, BUPROPION(WELLBUTRIN), CLONAZEPAM IF NEEDED, DESVENLAFAXINE (PRISTIQ), OXYCODONE IF NEEDED, PANTOPRAZOLE (PROTONIX), PRIMIDONE (MYSOLINE)  7 days prior to surgery STOP taking any Aspirin, Aleve, Naproxen, Ibuprofen, Motrin, Advil, Goody's, BC's, all herbal medications, fish oil, and all vitamins   Do not wear jewelry, make-up or nail polish.  Do not wear lotions, powders, or perfumes, or deoderant.  Do not shave 48 hours prior to surgery.  Men may shave face and neck.  Do not bring valuables to the hospital.  [facility] is not responsible for any belongings or valuables.  Contacts, dentures or bridgework may not be worn into surgery.  Leave your suitcase in the car.  After surgery it may be brought to your room.  For patients admitted to the hospital, discharge time will be determined by your treatment team.  Patients discharged the day of surgery will not be allowed to drive home.   Name and phone number of your driver:   SPECIAL INSTRUCTIONS-     Please read over the following fact sheets that you were given. Coughing and Deep Breathing and Surgical Site Infection Prevention

## 2017-08-18 ENCOUNTER — Encounter (HOSPITAL_COMMUNITY)
Admission: RE | Admit: 2017-08-18 | Discharge: 2017-08-18 | Disposition: A | Discharge status: Home / Self Care | Payer: 59 | Source: Ambulatory Visit | Attending: Orthopedic Surgery | Admitting: Orthopedic Surgery

## 2017-08-18 ENCOUNTER — Encounter (HOSPITAL_COMMUNITY): Payer: Self-pay

## 2017-08-18 DIAGNOSIS — F419 Anxiety disorder, unspecified: Secondary | ICD-10-CM | POA: Diagnosis not present

## 2017-08-18 DIAGNOSIS — Z87891 Personal history of nicotine dependence: Secondary | ICD-10-CM | POA: Diagnosis not present

## 2017-08-18 DIAGNOSIS — Z85820 Personal history of malignant melanoma of skin: Secondary | ICD-10-CM | POA: Diagnosis not present

## 2017-08-18 DIAGNOSIS — Z881 Allergy status to other antibiotic agents status: Secondary | ICD-10-CM | POA: Diagnosis not present

## 2017-08-18 DIAGNOSIS — Y929 Unspecified place or not applicable: Secondary | ICD-10-CM | POA: Diagnosis not present

## 2017-08-18 DIAGNOSIS — F329 Major depressive disorder, single episode, unspecified: Secondary | ICD-10-CM | POA: Diagnosis not present

## 2017-08-18 DIAGNOSIS — E785 Hyperlipidemia, unspecified: Secondary | ICD-10-CM | POA: Diagnosis not present

## 2017-08-18 DIAGNOSIS — K219 Gastro-esophageal reflux disease without esophagitis: Secondary | ICD-10-CM | POA: Diagnosis not present

## 2017-08-18 DIAGNOSIS — I10 Essential (primary) hypertension: Secondary | ICD-10-CM | POA: Diagnosis not present

## 2017-08-18 DIAGNOSIS — Z79899 Other long term (current) drug therapy: Secondary | ICD-10-CM | POA: Diagnosis not present

## 2017-08-18 DIAGNOSIS — S52571A Other intraarticular fracture of lower end of right radius, initial encounter for closed fracture: Secondary | ICD-10-CM | POA: Diagnosis not present

## 2017-08-18 HISTORY — DX: Unspecified fracture of unspecified forearm, initial encounter for closed fracture: S52.90XA

## 2017-08-18 HISTORY — DX: Unspecified osteoarthritis, unspecified site: M19.90

## 2017-08-18 HISTORY — DX: Cardiac murmur, unspecified: R01.1

## 2017-08-18 LAB — BASIC METABOLIC PANEL
ANION GAP: 8 (ref 5–15)
BUN: 9 mg/dL (ref 6–20)
CALCIUM: 9.3 mg/dL (ref 8.9–10.3)
CO2: 23 mmol/L (ref 22–32)
Chloride: 102 mmol/L (ref 101–111)
Creatinine, Ser: 0.62 mg/dL (ref 0.44–1.00)
GFR calc non Af Amer: >60 mL/min (ref >60)
Glucose, Bld: 96 mg/dL (ref 65–99)
Potassium: 4.1 mmol/L (ref 3.5–5.1)
SODIUM: 133 mmol/L — AB (ref 135–145)

## 2017-08-18 LAB — CBC
HCT: 35.7 % — ABNORMAL LOW (ref 36.0–46.0)
HEMOGLOBIN: 12 g/dL (ref 12.0–15.0)
MCH: 30.2 pg (ref 26.0–34.0)
MCHC: 33.6 g/dL (ref 30.0–36.0)
MCV: 89.7 fL (ref 78.0–100.0)
PLATELETS: 308 10*3/uL (ref 150–400)
RBC: 3.98 MIL/uL (ref 3.87–5.11)
RDW: 12.1 % (ref 11.5–15.5)
WBC: 6 10*3/uL (ref 4.0–10.5)

## 2017-08-18 LAB — SURGICAL PCR SCREEN
MRSA, PCR: NEGATIVE
STAPHYLOCOCCUS AUREUS: NEGATIVE

## 2017-08-18 NOTE — Progress Notes (Signed)
PCP - Dr. Annye Asa- [place]  Cardiologist - Dr. Pernell Dupre  Chest x-ray - Denies  EKG - 08/18/17  Stress Test - 15 years ago  ECHO - Denies  Cardiac Cath - 15 years ago  Sleep Study - Sleep Study Negative CPAP - None   Pt denies having chest pain, sob, or fever at this time. All instructions explained to the pt, with a verbal understanding of the material. Pt agrees to go over the instructions while at home for a better understanding. The opportunity to ask questions was provided.

## 2017-08-18 NOTE — Progress Notes (Signed)
Audrey Baxter            08/17/2017                          [place], [place] - [address] [facility]. [address] [place] [address] Phone: [phone] Fax: [place], [place] - [place] [address] Phone: [phone] Fax: [phone]              Your procedure is scheduled on   Saturday  08/19/17            Report to [facility] Admitting at 530 A.M.            Call this number if you have problems the morning of surgery:            [phone]             Remember:            Do not eat food or drink liquids after midnight.            Take these medicines the morning of surgery with A SIP OF WATER   ALBUTEROL, BUPROPION(WELLBUTRIN), CLONAZEPAM IF NEEDED, DESVENLAFAXINE (PRISTIQ), OXYCODONE IF NEEDED, PANTOPRAZOLE (PROTONIX), PRIMIDONE (MYSOLINE)  7 days prior to surgery STOP taking any Aspirin, Aleve, Naproxen, Ibuprofen, Motrin, Advil, Goody's, BC's, all herbal medications, fish oil, and all vitamins             Do not wear jewelry, make-up or nail polish.            Do not wear lotions, powders, or perfumes, or deodorant.            Do not shave 48 hours prior to surgery.              Do not bring valuables to the hospital.            [facility] is not responsible for any belongings or valuables.  Contacts, dentures or bridgework may not be worn into surgery.  Leave your suitcase in the car.  After surgery it may be brought to your room.  For patients admitted to the hospital, discharge time will be determined by your treatment team.  Patients discharged the day of surgery will not be allowed to drive home.      SPECIAL INSTRUCTIONS: [facility]- Preparing For Surgery  Before surgery, you can play an important role. Because skin is not sterile, your skin needs to be as free of germs as possible. You can  reduce the number of germs on your skin by washing with CHG (chlorahexidine gluconate) Soap before surgery.  CHG is an antiseptic cleaner which kills germs and bonds with the skin to continue killing germs even after washing.  Please do not use if you have an allergy to CHG or antibacterial soaps. If your skin becomes reddened/irritated stop using the CHG.  Do not shave (including legs and underarms) for at least 48 hours prior to first CHG shower. It is OK to shave your face.  Please follow these instructions carefully.   1. Shower the NIGHT BEFORE SURGERY and the MORNING OF SURGERY with CHG.   2. If you chose to wash your hair, wash your hair first as usual with your normal shampoo.  3. After you shampoo, rinse your hair and body thoroughly to  remove the shampoo.  4. Use CHG as you would any other liquid soap. You can apply CHG directly to the skin and wash gently with a scrungie or a clean washcloth.   5. Apply the CHG Soap to your body ONLY FROM THE NECK DOWN.  Do not use on open wounds or open sores. Avoid contact with your eyes, ears, mouth and genitals (private parts). Wash genitals (private parts) with your normal soap.  6. Wash thoroughly, paying special attention to the area where your surgery will be performed.  7. Thoroughly rinse your body with warm water from the neck down.  8. DO NOT shower/wash with your normal soap after using and rinsing off the CHG Soap.  9. Pat yourself dry with a CLEAN TOWEL.   10. Wear CLEAN PAJAMAS   11. Place CLEAN SHEETS on your bed the night of your first shower and DO NOT SLEEP WITH PETS.  Day of Surgery: Do not apply any deodorants/lotions. Please wear clean clothes to the hospital/surgery center.    Please read over the following fact sheets that you were given. Coughing and Deep Breathing and Surgical Site Infection Prevention

## 2017-08-18 NOTE — Anesthesia Preprocedure Evaluation (Addendum)
Anesthesia Evaluation  Patient identified by MRN, date of birth, ID band Patient awake    Reviewed: Allergy & Precautions, NPO status , Patient's Chart, lab work & pertinent test results  Airway Mallampati: II  TM Distance: >3 FB Neck ROM: Full    Dental no notable dental hx. (+) Dental Advisory Given   Pulmonary former smoker,    Pulmonary exam normal breath sounds clear to auscultation       Cardiovascular hypertension, Normal cardiovascular exam Rhythm:Regular Rate:Normal     Neuro/Psych Anxiety Depression negative neurological ROS     GI/Hepatic Neg liver ROS, GERD  ,  Endo/Other  negative endocrine ROS  Renal/GU negative Renal ROS  negative genitourinary   Musculoskeletal  (+) Arthritis ,   Abdominal   Peds  Hematology negative hematology ROS (+)   Anesthesia Other Findings   Reproductive/Obstetrics negative OB ROS                             Anesthesia Physical Anesthesia Plan  ASA: II  Anesthesia Plan: General   Post-op Pain Management:  Regional for Post-op pain   Induction: Intravenous  PONV Risk Score and Plan: 3 and Ondansetron, Dexamethasone, Midazolam, Propofol infusion and Treatment may vary due to age or medical condition  Airway Management Planned: LMA  Additional Equipment:   Intra-op Plan:   Post-operative Plan: Extubation in OR  Informed Consent: I have reviewed the patients History and Physical, chart, labs and discussed the procedure including the risks, benefits and alternatives for the proposed anesthesia with the patient or authorized representative who has indicated his/her understanding and acceptance.   Dental advisory given  Plan Discussed with: CRNA  Anesthesia Plan Comments:        Anesthesia Quick Evaluation

## 2017-08-19 ENCOUNTER — Encounter (HOSPITAL_COMMUNITY): Payer: Self-pay | Admitting: *Deleted

## 2017-08-19 ENCOUNTER — Ambulatory Visit (HOSPITAL_COMMUNITY): Payer: 59 | Admitting: Anesthesiology

## 2017-08-19 ENCOUNTER — Encounter (HOSPITAL_COMMUNITY)
Admission: RE | Disposition: A | Discharge status: Home / Self Care | Payer: Self-pay | Source: Ambulatory Visit | Attending: Orthopedic Surgery

## 2017-08-19 ENCOUNTER — Ambulatory Visit (HOSPITAL_COMMUNITY)
Admission: RE | Admit: 2017-08-19 | Discharge: 2017-08-19 | Disposition: A | Discharge status: Home / Self Care | Payer: 59 | Source: Ambulatory Visit | Attending: Orthopedic Surgery | Admitting: Orthopedic Surgery

## 2017-08-19 DIAGNOSIS — S52571A Other intraarticular fracture of lower end of right radius, initial encounter for closed fracture: Secondary | ICD-10-CM | POA: Insufficient documentation

## 2017-08-19 DIAGNOSIS — K219 Gastro-esophageal reflux disease without esophagitis: Secondary | ICD-10-CM | POA: Diagnosis not present

## 2017-08-19 DIAGNOSIS — Y929 Unspecified place or not applicable: Secondary | ICD-10-CM | POA: Insufficient documentation

## 2017-08-19 DIAGNOSIS — F329 Major depressive disorder, single episode, unspecified: Secondary | ICD-10-CM | POA: Insufficient documentation

## 2017-08-19 DIAGNOSIS — Z881 Allergy status to other antibiotic agents status: Secondary | ICD-10-CM | POA: Insufficient documentation

## 2017-08-19 DIAGNOSIS — S52501A Unspecified fracture of the lower end of right radius, initial encounter for closed fracture: Secondary | ICD-10-CM

## 2017-08-19 DIAGNOSIS — G8918 Other acute postprocedural pain: Secondary | ICD-10-CM | POA: Diagnosis not present

## 2017-08-19 DIAGNOSIS — Z79899 Other long term (current) drug therapy: Secondary | ICD-10-CM | POA: Insufficient documentation

## 2017-08-19 DIAGNOSIS — I10 Essential (primary) hypertension: Secondary | ICD-10-CM | POA: Insufficient documentation

## 2017-08-19 DIAGNOSIS — Z85820 Personal history of malignant melanoma of skin: Secondary | ICD-10-CM | POA: Diagnosis not present

## 2017-08-19 DIAGNOSIS — E785 Hyperlipidemia, unspecified: Secondary | ICD-10-CM | POA: Insufficient documentation

## 2017-08-19 DIAGNOSIS — F419 Anxiety disorder, unspecified: Secondary | ICD-10-CM | POA: Diagnosis not present

## 2017-08-19 DIAGNOSIS — M1990 Unspecified osteoarthritis, unspecified site: Secondary | ICD-10-CM | POA: Diagnosis not present

## 2017-08-19 DIAGNOSIS — Z87891 Personal history of nicotine dependence: Secondary | ICD-10-CM | POA: Insufficient documentation

## 2017-08-19 HISTORY — DX: Unspecified fracture of the lower end of right radius, initial encounter for closed fracture: S52.501A

## 2017-08-19 HISTORY — PX: OPEN REDUCTION INTERNAL FIXATION (ORIF) DISTAL RADIAL FRACTURE: SHX5989

## 2017-08-19 SURGERY — OPEN REDUCTION INTERNAL FIXATION (ORIF) DISTAL RADIUS FRACTURE
Anesthesia: General | Site: Wrist | Laterality: Right

## 2017-08-19 MED ORDER — FENTANYL CITRATE (PF) 100 MCG/2ML IJ SOLN
INTRAMUSCULAR | Status: DC | PRN
  Administered 2017-08-19 (×2): 50 ug via INTRAVENOUS

## 2017-08-19 MED ORDER — OXYCODONE HCL 5 MG PO TABS
5.0000 mg | ORAL_TABLET | Freq: Once | ORAL | Status: AC
  Administered 2017-08-19: 5 mg via ORAL

## 2017-08-19 MED ORDER — CLINDAMYCIN PHOSPHATE 900 MG/50ML IV SOLN
INTRAVENOUS | Status: DC | PRN
  Administered 2017-08-19: 900 mg via INTRAVENOUS

## 2017-08-19 MED ORDER — VANCOMYCIN HCL IN DEXTROSE 1-5 GM/200ML-% IV SOLN
INTRAVENOUS | Status: AC
  Administered 2017-08-19: 1000 mg via INTRAVENOUS
  Filled 2017-08-19: qty 200

## 2017-08-19 MED ORDER — FENTANYL CITRATE (PF) 100 MCG/2ML IJ SOLN
25.0000 ug | INTRAMUSCULAR | Status: DC | PRN
Start: 2017-08-19 — End: 2017-08-19
  Administered 2017-08-19: 25 ug via INTRAVENOUS

## 2017-08-19 MED ORDER — VANCOMYCIN HCL IN DEXTROSE 1-5 GM/200ML-% IV SOLN
1000.0000 mg | INTRAVENOUS | Status: AC
  Administered 2017-08-19: 1000 mg via INTRAVENOUS

## 2017-08-19 MED ORDER — PROPOFOL 10 MG/ML IV BOLUS
INTRAVENOUS | Status: DC | PRN
  Administered 2017-08-19: 200 mg via INTRAVENOUS

## 2017-08-19 MED ORDER — FENTANYL CITRATE (PF) 250 MCG/5ML IJ SOLN
INTRAMUSCULAR | Status: AC
Start: 2017-08-19 — End: 2017-08-19
  Filled 2017-08-19: qty 5

## 2017-08-19 MED ORDER — DIPHENHYDRAMINE HCL 50 MG/ML IJ SOLN
INTRAMUSCULAR | Status: AC
  Filled 2017-08-19: qty 1

## 2017-08-19 MED ORDER — 0.9 % SODIUM CHLORIDE (POUR BTL) OPTIME
TOPICAL | Status: DC | PRN
  Administered 2017-08-19: 1000 mL

## 2017-08-19 MED ORDER — OXYCODONE HCL 5 MG PO TABS
ORAL_TABLET | ORAL | Status: AC
  Filled 2017-08-19: qty 1

## 2017-08-19 MED ORDER — DIPHENHYDRAMINE HCL 50 MG/ML IJ SOLN
INTRAMUSCULAR | Status: DC | PRN
  Administered 2017-08-19: 25 mg via INTRAVENOUS

## 2017-08-19 MED ORDER — PROMETHAZINE HCL 25 MG/ML IJ SOLN: 6.2500 mg | INTRAMUSCULAR | Status: DC | PRN

## 2017-08-19 MED ORDER — CHLORHEXIDINE GLUCONATE 4 % EX LIQD: 60.0000 mL | Freq: Once | CUTANEOUS | Status: DC

## 2017-08-19 MED ORDER — PROPOFOL 10 MG/ML IV BOLUS
INTRAVENOUS | Status: AC
  Filled 2017-08-19: qty 20

## 2017-08-19 MED ORDER — DEXAMETHASONE SODIUM PHOSPHATE 4 MG/ML IJ SOLN
INTRAMUSCULAR | Status: DC | PRN
  Administered 2017-08-19: 10 mg via INTRAVENOUS

## 2017-08-19 MED ORDER — LIDOCAINE HCL (CARDIAC) 20 MG/ML IV SOLN
INTRAVENOUS | Status: DC | PRN
  Administered 2017-08-19: 60 mg via INTRAVENOUS

## 2017-08-19 MED ORDER — OXYCODONE HCL 5 MG PO TABS: 5.0000 mg | ORAL_TABLET | ORAL | 0 refills | Status: AC | PRN

## 2017-08-19 MED ORDER — BUPIVACAINE-EPINEPHRINE (PF) 0.5% -1:200000 IJ SOLN
INTRAMUSCULAR | Status: DC | PRN
  Administered 2017-08-19: 20 mL via PERINEURAL

## 2017-08-19 MED ORDER — CLINDAMYCIN PHOSPHATE 900 MG/50ML IV SOLN
INTRAVENOUS | Status: AC
  Filled 2017-08-19: qty 50

## 2017-08-19 MED ORDER — ONDANSETRON HCL 4 MG/2ML IJ SOLN
INTRAMUSCULAR | Status: DC | PRN
  Administered 2017-08-19: 4 mg via INTRAVENOUS

## 2017-08-19 MED ORDER — CLINDAMYCIN HCL 150 MG PO CAPS
150.0000 mg | ORAL_CAPSULE | Freq: Four times a day (QID) | ORAL | 0 refills | Status: AC
Start: 2017-08-19 — End: 2017-08-29

## 2017-08-19 MED ORDER — LACTATED RINGERS IV SOLN
INTRAVENOUS | Status: DC
  Administered 2017-08-19 (×2): via INTRAVENOUS

## 2017-08-19 MED ORDER — MIDAZOLAM HCL 5 MG/5ML IJ SOLN
INTRAMUSCULAR | Status: DC | PRN
  Administered 2017-08-19: 1 mg via INTRAVENOUS

## 2017-08-19 MED ORDER — FENTANYL CITRATE (PF) 100 MCG/2ML IJ SOLN
INTRAMUSCULAR | Status: AC
  Filled 2017-08-19: qty 2

## 2017-08-19 MED ORDER — MIDAZOLAM HCL 2 MG/2ML IJ SOLN
INTRAMUSCULAR | Status: AC
  Filled 2017-08-19: qty 2

## 2017-08-19 SURGICAL SUPPLY — 61 items
BANDAGE ACE 3X5.8 VEL STRL LF (GAUZE/BANDAGES/DRESSINGS) ×2 IMPLANT
BANDAGE ACE 4X5 VEL STRL LF (GAUZE/BANDAGES/DRESSINGS) ×2 IMPLANT
BIT DRILL 2.2 SS TIBIAL (BIT) ×1 IMPLANT
BLADE CLIPPER SURG (BLADE) IMPLANT
BNDG CMPR 9X4 STRL LF SNTH (GAUZE/BANDAGES/DRESSINGS) ×1
BNDG CONFORM 3 STRL LF (GAUZE/BANDAGES/DRESSINGS) ×1 IMPLANT
BNDG ESMARK 4X9 LF (GAUZE/BANDAGES/DRESSINGS) ×2 IMPLANT
BNDG GAUZE ELAST 4 BULKY (GAUZE/BANDAGES/DRESSINGS) ×4 IMPLANT
CORDS BIPOLAR (ELECTRODE) ×2 IMPLANT
COVER SURGICAL LIGHT HANDLE (MISCELLANEOUS) ×2 IMPLANT
CUFF TOURNIQUET SINGLE 18IN (TOURNIQUET CUFF) ×2 IMPLANT
CUFF TOURNIQUET SINGLE 24IN (TOURNIQUET CUFF) IMPLANT
DECANTER SPIKE VIAL GLASS SM (MISCELLANEOUS) IMPLANT
DRAIN TLS ROUND 10FR (DRAIN) IMPLANT
DRAPE OEC MINIVIEW 54X84 (DRAPES) ×1 IMPLANT
DRAPE U-SHAPE 47X51 STRL (DRAPES) ×2 IMPLANT
DRSG ADAPTIC 3X8 NADH LF (GAUZE/BANDAGES/DRESSINGS) ×2 IMPLANT
GAUZE SPONGE 4X4 12PLY STRL (GAUZE/BANDAGES/DRESSINGS) ×2 IMPLANT
GAUZE XEROFORM 5X9 LF (GAUZE/BANDAGES/DRESSINGS) ×2 IMPLANT
GLOVE BIOGEL M 8.0 STRL (GLOVE) ×2 IMPLANT
GLOVE SS BIOGEL STRL SZ 8 (GLOVE) ×1 IMPLANT
GLOVE SUPERSENSE BIOGEL SZ 8 (GLOVE) ×1
GOWN STRL REUS W/ TWL LRG LVL3 (GOWN DISPOSABLE) ×3 IMPLANT
GOWN STRL REUS W/ TWL XL LVL3 (GOWN DISPOSABLE) ×3 IMPLANT
GOWN STRL REUS W/TWL LRG LVL3 (GOWN DISPOSABLE) ×6
GOWN STRL REUS W/TWL XL LVL3 (GOWN DISPOSABLE) ×6
KIT BASIN OR (CUSTOM PROCEDURE TRAY) ×2 IMPLANT
KIT ROOM TURNOVER OR (KITS) ×2 IMPLANT
MANIFOLD NEPTUNE II (INSTRUMENTS) ×1 IMPLANT
NEEDLE 22X1 1/2 (OR ONLY) (NEEDLE) IMPLANT
NS IRRIG 1000ML POUR BTL (IV SOLUTION) ×2 IMPLANT
PACK ORTHO EXTREMITY (CUSTOM PROCEDURE TRAY) ×2 IMPLANT
PAD ARMBOARD 7.5X6 YLW CONV (MISCELLANEOUS) ×4 IMPLANT
PAD CAST 4YDX4 CTTN HI CHSV (CAST SUPPLIES) ×1 IMPLANT
PADDING CAST COTTON 4X4 STRL (CAST SUPPLIES) ×2
PEG LOCKING SMOOTH 2.2X18 (Peg) ×2 IMPLANT
PEG LOCKING SMOOTH 2.2X20 (Screw) ×2 IMPLANT
PEG LOCKING SMOOTH 2.2X22 (Screw) ×1 IMPLANT
PLATE NARROW DVR RIGHT (Plate) ×1 IMPLANT
SCREW LOCK 10X2.7X3 LD THRD (Screw) IMPLANT
SCREW LOCK 12X2.7X 3 LD (Screw) IMPLANT
SCREW LOCK 14X2.7X 3 LD TPR (Screw) IMPLANT
SCREW LOCKING 2.7X10MM (Screw) ×2 IMPLANT
SCREW LOCKING 2.7X12MM (Screw) ×6 IMPLANT
SCREW LOCKING 2.7X14 (Screw) ×4 IMPLANT
SCRUB BETADINE 4OZ XXX (MISCELLANEOUS) ×2 IMPLANT
SOL PREP POV-IOD 4OZ 10% (MISCELLANEOUS) ×2 IMPLANT
SPLINT FIBERGLASS 3X35 (CAST SUPPLIES) ×1 IMPLANT
SPONGE LAP 4X18 X RAY DECT (DISPOSABLE) IMPLANT
SUT MNCRL AB 4-0 PS2 18 (SUTURE) ×2 IMPLANT
SUT PROLENE 3 0 PS 2 (SUTURE) ×1 IMPLANT
SUT PROLENE 4 0 PS 2 18 (SUTURE) ×2 IMPLANT
SUT VIC AB 3-0 FS2 27 (SUTURE) IMPLANT
SYR CONTROL 10ML LL (SYRINGE) IMPLANT
SYSTEM CHEST DRAIN TLS 7FR (DRAIN) ×1 IMPLANT
TOWEL OR 17X24 6PK STRL BLUE (TOWEL DISPOSABLE) ×2 IMPLANT
TOWEL OR 17X26 10 PK STRL BLUE (TOWEL DISPOSABLE) ×2 IMPLANT
TUBE CONNECTING 12X1/4 (SUCTIONS) ×2 IMPLANT
TUBE EVACUATION TLS (MISCELLANEOUS) ×2 IMPLANT
UNDERPAD 30X30 (UNDERPADS AND DIAPERS) ×2 IMPLANT
WATER STERILE IRR 1000ML POUR (IV SOLUTION) ×2 IMPLANT

## 2017-08-19 NOTE — Progress Notes (Signed)
Orthopedic Tech Progress Note Patient Details:  LING FLESCH 1956-09-11 [id number]  Ortho Devices Type of Ortho Device: Arm sling Ortho Device/Splint Location: rue Ortho Device/Splint Interventions: Application   Hildred Priest 08/19/2017, 10:07 AM As ordered by Ileana Roup; stated by RN

## 2017-08-19 NOTE — Op Note (Signed)
See Dictation#[id number] SP ORIF DRF Mayfield Schoene MD

## 2017-08-19 NOTE — Discharge Instructions (Signed)

## 2017-08-19 NOTE — H&P (Signed)
Audrey Baxter is an 61 y.o. female.   Chief Complaint: right DRF HPI: Patient presents for evaluation and treatment of the of their upper extremity predicament. The patient denies neck, back, chest or  abdominal pain. The patient notes that they have no lower extremity problems. The patients primary complaint is noted. We are planning surgical care pathway for the upper extremity.  Past Medical History:  Diagnosis Date  . Anxiety   . Arthritis   . Cancer ([place])    cervical/melanoma  . Depression   . GERD (gastroesophageal reflux disease)   . Heart murmur   . History of colon polyps   . Hyperlipidemia   . Hypertension   . Osteoporosis   . Radial fracture    Right  . Tremor     Past Surgical History:  Procedure Laterality Date  . BREAST SURGERY     Left lumpectomy  . BUNIONECTOMY Bilateral   . CARDIAC CATHETERIZATION     15 years ago  . WRIST ARTHROPLASTY  2007    Family History  Problem Relation Age of Onset  . COPD Mother   . Cancer Father        lung, bladder  . Alcohol abuse Father   . HIV/AIDS Brother   . Cancer Maternal Aunt        breast  . Alcohol abuse Paternal Aunt   . Stroke Maternal Grandmother   . Heart attack Maternal Grandfather   . Cancer Paternal Grandfather        lung/spine  . Cancer Brother        lung   Social History:  reports that she quit smoking about 38 years ago. She has never used smokeless tobacco. She reports that she drinks alcohol. She reports that she does not use drugs.  Allergies:  Allergies  Allergen Reactions  . Atorvastatin Other (See Comments)    LEG CRAMPS   . Erythromycin Base Other (See Comments)    UPSETS STOMACH   . Pyridium [Phenazopyridine Hcl] Other (See Comments)    TUNNEL VISION  . Simvastatin Other (See Comments)    Other reaction(s): muscle aches  . Cephalexin Rash  . Vancomycin Itching and Rash    Medications Prior to Admission  Medication Sig Dispense Refill  . ARIPiprazole (ABILIFY) 10 MG  tablet Take 5 mg by mouth every evening.     Marland Kitchen buPROPion (WELLBUTRIN XL) 300 MG 24 hr tablet Take 300 mg by mouth daily.    . cholecalciferol (VITAMIN D) 1000 units tablet Take 1,000 Units by mouth daily.    . clonazePAM (KLONOPIN) 0.5 MG tablet Take 0.5 mg by mouth 3 (three) times daily as needed for anxiety.    Marland Kitchen desvenlafaxine (PRISTIQ) 100 MG 24 hr tablet Take 100 mg by mouth daily.    Marland Kitchen ibuprofen (ADVIL,MOTRIN) 200 MG tablet Take 400 mg by mouth 3 (three) times daily as needed for mild pain.    Marland Kitchen lisinopril (PRINIVIL,ZESTRIL) 10 MG tablet Take 1 tablet (10 mg total) by mouth daily. 30 tablet 3  . pantoprazole (PROTONIX) 40 MG tablet Take 1 tablet (40 mg total) by mouth daily. (Patient taking differently: Take 40 mg by mouth every evening. ) 30 tablet 3  . primidone (MYSOLINE) 50 MG tablet TAKE 2 TABLETS BY MOUTH IN THE MORNING, 2 TABLETS IN THE AFTERNOON AND 1 TABLET IN THE EVENING AS DIRECTED 450 tablet 1  . rosuvastatin (CRESTOR) 10 MG tablet Take 1 tablet (10 mg total) by mouth daily. 90 tablet  1  . zolpidem (AMBIEN) 5 MG tablet Take 5 mg by mouth at bedtime as needed for sleep.    Marland Kitchen albuterol (PROVENTIL HFA;VENTOLIN HFA) 108 (90 Base) MCG/ACT inhaler Inhale 2 puffs into the lungs every 6 (six) hours as needed for wheezing or shortness of breath. (Patient not taking: Reported on 08/01/2017) 1 Inhaler 2  . naproxen (NAPROSYN) 250 MG tablet Take 1 tablet (250 mg total) by mouth 2 (two) times daily with a meal. 30 tablet 0  . Omega-3 Fatty Acids (FISH OIL) 1000 MG CAPS Take 1,000 mg by mouth daily.    Marland Kitchen oxyCODONE-acetaminophen (PERCOCET) 5-325 MG tablet Take 1-2 tablets by mouth every 6 (six) hours as needed for moderate pain or severe pain. 18 tablet 0  . Vitamin D, Ergocalciferol, (DRISDOL) 50000 units CAPS capsule Take 1 capsule (50,000 Units total) by mouth every 7 (seven) days. 12 capsule 0    Results for orders placed or performed during the hospital encounter of 08/18/17 (from the past  48 hour(s))  Basic metabolic panel     Status: Abnormal   Collection Time: 08/18/17  9:41 AM  Result Value Ref Range   Sodium 133 (L) 135 - 145 mmol/L   Potassium 4.1 3.5 - 5.1 mmol/L   Chloride 102 101 - 111 mmol/L   CO2 23 22 - 32 mmol/L   Glucose, Bld 96 65 - 99 mg/dL   BUN 9 6 - 20 mg/dL   Creatinine, Ser 0.62 0.44 - 1.00 mg/dL   Calcium 9.3 8.9 - 10.3 mg/dL   GFR calc non Af Amer >60 >60 mL/min   GFR calc Af Amer >60 >60 mL/min    Comment: (NOTE) The eGFR has been calculated using the CKD EPI equation. This calculation has not been validated in all clinical situations. eGFR's persistently <60 mL/min signify possible Chronic Kidney Disease.    Anion gap 8 5 - 15  CBC     Status: Abnormal   Collection Time: 08/18/17  9:41 AM  Result Value Ref Range   WBC 6.0 4.0 - 10.5 K/uL   RBC 3.98 3.87 - 5.11 MIL/uL   Hemoglobin 12.0 12.0 - 15.0 g/dL   HCT 35.7 (L) 36.0 - 46.0 %   MCV 89.7 78.0 - 100.0 fL   MCH 30.2 26.0 - 34.0 pg   MCHC 33.6 30.0 - 36.0 g/dL   RDW 12.1 11.5 - 15.5 %   Platelets 308 150 - 400 K/uL  Surgical pcr screen     Status: None   Collection Time: 08/18/17  9:41 AM  Result Value Ref Range   MRSA, PCR NEGATIVE NEGATIVE   Staphylococcus aureus NEGATIVE NEGATIVE    Comment: (NOTE) The Xpert SA Assay (FDA approved for NASAL specimens in patients 27 years of age and older), is one component of a comprehensive surveillance program. It is not intended to diagnose infection nor to guide or monitor treatment.    No results found.  Review of Systems  Respiratory: Negative.   Cardiovascular: Negative.   Gastrointestinal: Negative.   Genitourinary: Negative.     Blood pressure 137/78, pulse 69, temperature 98.1 F (36.7 C), temperature source Oral, resp. rate 18, height 5' 3"  (1.6 m), weight 66.8 kg (147 lb 4.8 oz), SpO2 98 %. Physical Exam  Acute displaced DRF NVI The patient is alert and oriented in no acute distress. The patient complains of pain in the  affected upper extremity.  The patient is noted to have a normal HEENT exam. Lung fields  show equal chest expansion and no shortness of breath. Abdomen exam is nontender without distention. Lower extremity examination does not show any fracture dislocation or blood clot symptoms. Pelvis is stable and the neck and back are stable and nontender. Assessment/Plan We are planning surgery for your upper extremity. The risk and benefits of surgery to include risk of bleeding, infection, anesthesia,  damage to normal structures and failure of the surgery to accomplish its intended goals of relieving symptoms and restoring function have been discussed in detail. With this in mind we plan to proceed. I have specifically discussed with the patient the pre-and postoperative regime and the dos and don'ts and risk and benefits in great detail. Risk and benefits of surgery also include risk of dystrophy(CRPS), chronic nerve pain, failure of the healing process to go onto completion and other inherent risks of surgery The relavent the pathophysiology of the disease/injury process, as well as the alternatives for treatment and postoperative course of action has been discussed in great detail with the patient who desires to proceed.  We will do everything in our power to help you (the patient) restore function to the upper extremity. It is a pleasure to see this patient today.   Plan DRF ORIF  Paulene Floor, MD 08/19/2017, 7:46 AM

## 2017-08-19 NOTE — Transfer of Care (Signed)
Immediate Anesthesia Transfer of Care Note  Patient: THAI HEMRICK  Procedure(s) Performed: Procedure(s) with comments: OPEN REDUCTION INTERNAL FIXATION (ORIF) DISTAL RADIAL FRACTURE WITH REPAIR RECONSTRUCTION, ALLOGRAFT BONE GRAFT AS NECESSARY (Right) - 16 MINS  Patient Location: PACU  Anesthesia Type:General  Level of Consciousness: awake, alert  and oriented  Airway & Oxygen Therapy: Patient Spontanous Breathing and Patient connected to nasal cannula oxygen  Post-op Assessment: Report given to RN and Post -op Vital signs reviewed and stable  Post vital signs: Reviewed and stable  Last Vitals:  Vitals:   08/19/17 0616  BP: 137/78  Pulse: 69  Resp: 18  Temp: 36.7 C  SpO2: 98%    Last Pain:  Vitals:   08/19/17 0630  TempSrc:   PainSc: 3          Complications: No apparent anesthesia complications

## 2017-08-19 NOTE — Anesthesia Postprocedure Evaluation (Signed)
Anesthesia Post Note  Patient: Audrey Baxter  Procedure(s) Performed: Procedure(s) (LRB): OPEN REDUCTION INTERNAL FIXATION (ORIF) DISTAL RADIAL FRACTURE WITH REPAIR RECONSTRUCTION, ALLOGRAFT BONE GRAFT AS NECESSARY (Right)     Patient location during evaluation: PACU Anesthesia Type: General Level of consciousness: awake and alert Pain management: pain level controlled Vital Signs Assessment: post-procedure vital signs reviewed and stable Respiratory status: spontaneous breathing, nonlabored ventilation, respiratory function stable and patient connected to nasal cannula oxygen Cardiovascular status: blood pressure returned to baseline and stable Postop Assessment: no signs of nausea or vomiting Anesthetic complications: no    Last Vitals:  Vitals:   08/19/17 1015 08/19/17 1018  BP:  (!) 114/99  Pulse:  74  Resp:  13  Temp: (!) 36.2 C   SpO2:  97%    Last Pain:  Vitals:   08/19/17 1025  TempSrc:   PainSc: [place]

## 2017-08-19 NOTE — Anesthesia Procedure Notes (Signed)
Procedure Name: LMA Insertion Date/Time: 08/19/2017 8:04 AM Performed by: Clearnce Sorrel Pre-anesthesia Checklist: Patient identified, Emergency Drugs available, Suction available, Patient being monitored and Timeout performed Patient Re-evaluated:Patient Re-evaluated prior to induction Oxygen Delivery Method: Circle system utilized Preoxygenation: Pre-oxygenation with 100% oxygen Induction Type: IV induction LMA: LMA inserted LMA Size: 4.0 Number of attempts: 1 Placement Confirmation: positive ETCO2 and breath sounds checked- equal and bilateral Tube secured with: Tape Dental Injury: Teeth and Oropharynx as per pre-operative assessment

## 2017-08-19 NOTE — Progress Notes (Signed)
Drain removed from surgical drsg per MD Gramig orders.  Pt tolerated well.  No drainage noted.   Leanord Asal RN

## 2017-08-19 NOTE — Anesthesia Procedure Notes (Signed)
Anesthesia Regional Block: Supraclavicular block   Pre-Anesthetic Checklist: ,, timeout performed, Correct Patient, Correct Site, Correct Laterality, Correct Procedure, Correct Position, site marked, Risks and benefits discussed,  Surgical consent,  Pre-op evaluation,  At surgeon's request and post-op pain management  Laterality: Right  Prep: chloraprep       Needles:  Injection technique: Single-shot  Needle Type: Echogenic Needle     Needle Length: 5cm  Needle Gauge: 21     Additional Needles:   Procedures: ultrasound guided,,,,,,,,  Narrative:  Start time: 08/19/2017 7:21 AM End time: 08/19/2017 7:27 AM Injection made incrementally with aspirations every 5 mL.  Performed by: Personally  Anesthesiologist: Renold Don E  Additional Notes: No pain on injection. No increased resistance to injection. Injection made in 5cc increments. Good needle visualization. Patient tolerated the procedure well.

## 2017-08-21 ENCOUNTER — Encounter (HOSPITAL_COMMUNITY): Payer: Self-pay | Admitting: Orthopedic Surgery

## 2017-08-21 NOTE — Op Note (Signed)
NAME:  CONTESSA, PREUSS                 ACCOUNT NO.:  MEDICAL RECORD NO.:  [id number]  LOCATION:                                 FACILITY:  PHYSICIAN:  Satira Anis. Amedeo Plenty, M.D.     DATE OF BIRTH:  DATE OF PROCEDURE:  08/19/2017 DATE OF DISCHARGE:  08/19/2017                              OPERATIVE REPORT   PREOPERATIVE DIAGNOSIS:  Comminuted complex greater than 5-part intra- articular distal radius fracture, right upper extremity.  POSTOPERATIVE DIAGNOSIS:  Comminuted complex greater than 5-part intra- articular distal radius fracture, right upper extremity.  PROCEDURE: 1. Open reduction and internal fixation, greater than 5-part intra-     articular distal radius fracture with DVR Crosslock plate and screw     construct. 2. 4-view radiographic series performed, examined, and interpreted by     myself.  SURGEON:  Satira Anis. Amedeo Plenty, M.D.  ASSISTANT:  Avelina Laine, P.A.-C.  COMPLICATIONS:  None.  ANESTHESIA:  General with block.  TOURNIQUET TIME:  Less than 40 minutes.  INDICATIONS:  A 61 year old female with the above-mentioned diagnosis. She understands risks and benefits of the surgery and desires to proceed.  OPERATION IN DETAIL:  The patient was seen by myself and Anesthesia, taken to the operative theater, underwent a Hibiclens by myself followed by a 10-minute surgical Betadine scrub and paint by Mr. Avelina Laine, [facility].  Following this, sterile field was secured.  Time-out observed. Preoperative clindamycin was given as she had a small/mild reaction to vancomycin and does have a pre-existing Keflex allergy.  Once antibiotics were successfully in the form of clindamycin.  The operation commenced with a volar radial incision.  Dissection was carried down. FCR tendon sheath was incised dorsally and palmarly.  Dissection was carried down very carefully, and the pronator was incised as the pronator and carpal canal contents were swept ulnarly.  Following this, we  performed reduction, held reduction and applied a DVR Crosslock plate without difficulty.  The patient had radial height inclination and volar tilt were restored to my satisfaction.  Following this, we demonstrated full passive range of motion.  There were no mechanical clicks issues or problems with the DRUJ or the radiocarpal and midcarpal joints.  We irrigated with a liter of saline, closed the pronator, irrigated additionally and then closed wound over drain.  Short-arm splint was placed.  She will see [place] in 12 days for a followup examination at which time we will remove her sutures and apply a cast. Do's and don'ts have been discussed and all questions have been encouraged and answered.  We will look forward to discharging her today on clindamycin as well as Percocet regime, and precautions including Peri-Colace for constipatory affects of pain medicine and vitamin C to promote healing were discussed.  All questions have been encouraged and answered and addressed.     Satira Anis. Amedeo Plenty, M.D.     [facility]  D:  08/19/2017  T:  08/19/2017  Job:  [id number]

## 2017-08-23 ENCOUNTER — Ambulatory Visit: Payer: 59 | Admitting: Neurology

## 2017-08-24 ENCOUNTER — Ambulatory Visit: Payer: 59 | Admitting: Family Medicine

## 2017-08-28 MED FILL — LISINOPRIL 10 MG TABS: 10 | 30 days supply | Qty: 30 | Fill #3

## 2017-08-28 MED FILL — PANTOPRAZOLE SOD DR 40 MG T: 40 | 30 days supply | Qty: 30 | Fill #1

## 2017-09-04 DIAGNOSIS — Z4789 Encounter for other orthopedic aftercare: Secondary | ICD-10-CM | POA: Diagnosis not present

## 2017-09-04 DIAGNOSIS — M65342 Trigger finger, left ring finger: Secondary | ICD-10-CM | POA: Diagnosis not present

## 2017-09-15 ENCOUNTER — Encounter: Payer: Self-pay | Admitting: Family Medicine

## 2017-09-15 ENCOUNTER — Ambulatory Visit (INDEPENDENT_AMBULATORY_CARE_PROVIDER_SITE_OTHER): Payer: 59 | Admitting: Family Medicine

## 2017-09-15 ENCOUNTER — Encounter: Payer: Self-pay | Admitting: General Practice

## 2017-09-15 ENCOUNTER — Other Ambulatory Visit (HOSPITAL_COMMUNITY)
Admission: RE | Admit: 2017-09-15 | Discharge: 2017-09-15 | Disposition: A | Discharge status: Home / Self Care | Payer: 59 | Source: Ambulatory Visit | Attending: Family Medicine | Admitting: Family Medicine

## 2017-09-15 VITALS — BP 125/82 | HR 80 | Temp 98.0°F | Resp 16 | Ht 63.0 in | Wt 147.5 lb

## 2017-09-15 DIAGNOSIS — Z124 Encounter for screening for malignant neoplasm of cervix: Secondary | ICD-10-CM | POA: Diagnosis not present

## 2017-09-15 DIAGNOSIS — E2839 Other primary ovarian failure: Secondary | ICD-10-CM | POA: Insufficient documentation

## 2017-09-15 DIAGNOSIS — Z23 Encounter for immunization: Secondary | ICD-10-CM | POA: Insufficient documentation

## 2017-09-15 DIAGNOSIS — I10 Essential (primary) hypertension: Secondary | ICD-10-CM | POA: Diagnosis not present

## 2017-09-15 NOTE — Progress Notes (Signed)
Pre visit review using our clinic review tool, if applicable. No additional management support is needed unless otherwise documented below in the visit note. 

## 2017-09-15 NOTE — Assessment & Plan Note (Signed)
Pap collected. 

## 2017-09-15 NOTE — Assessment & Plan Note (Signed)
Chronic problem.  BP is well controlled today since decreasing her Lisinopril dose to 5mg .  She wants to wean off meds entirely.  Will attempt this but pt is aware that she needs to check home BPs and if above 130/90, she is to call and restart Lisinopril 5mg  daily.  Will follow.

## 2017-09-15 NOTE — Patient Instructions (Signed)
Follow up in 3 months to recheck BP We'll notify you of your pap results and make any changes if needed STOP the Lisinopril and continue to monitor your blood pressure.  If consistently higher than 130/90, let me know and restart the 5mg  Lisinopril Call with any questions or concerns Happy Fall!!

## 2017-09-15 NOTE — Progress Notes (Signed)
   Subjective:    Patient ID: Audrey Baxter, female    DOB: [birth year], 61 y.o.   MRN: [id number]  HPI HTN- chronic problem, well controlled today since decreasing Lisinopril to 5mg  daily.  Pt is interested in stopping altogether.  No CP, SOB, HAs, visual changes.  Pap- pt due for pap today.   Review of Systems For ROS see HPI     Objective:   Physical Exam  Constitutional: She is oriented to person, place, and time. She appears well-developed and well-nourished. No distress.  HENT:  Head: Normocephalic and atraumatic.  Eyes: Pupils are equal, round, and reactive to light. Conjunctivae and EOM are normal.  Neck: Normal range of motion. Neck supple. No thyromegaly present.  Cardiovascular: Normal rate, regular rhythm, normal heart sounds and intact distal pulses.   No murmur heard. Pulmonary/Chest: Effort normal and breath sounds normal. No respiratory distress.  Abdominal: Soft. She exhibits no distension. There is no tenderness.  Genitourinary: Rectal exam shows no external hemorrhoid and no fissure. There is no rash, tenderness, lesion or injury on the right labia. There is no rash, tenderness, lesion or injury on the left labia. Uterus is not deviated, not enlarged, not fixed and not tender. Cervix exhibits no motion tenderness, no discharge and no friability. Right adnexum displays no mass, no tenderness and no fullness. Left adnexum displays no mass, no tenderness and no fullness. No erythema, tenderness or bleeding in the vagina. No foreign body in the vagina. No signs of injury around the vagina. No vaginal discharge found.  Musculoskeletal: She exhibits no edema.  Lymphadenopathy:    She has no cervical adenopathy.  Neurological: She is alert and oriented to person, place, and time.  Skin: Skin is warm and dry.  Psychiatric: She has a normal mood and affect. Her behavior is normal.  Vitals reviewed.         Assessment & Plan:

## 2017-09-18 DIAGNOSIS — M65342 Trigger finger, left ring finger: Secondary | ICD-10-CM | POA: Diagnosis not present

## 2017-09-18 DIAGNOSIS — S52571D Other intraarticular fracture of lower end of right radius, subsequent encounter for closed fracture with routine healing: Secondary | ICD-10-CM | POA: Diagnosis not present

## 2017-09-18 DIAGNOSIS — S62101A Fracture of unspecified carpal bone, right wrist, initial encounter for closed fracture: Secondary | ICD-10-CM | POA: Diagnosis not present

## 2017-09-19 ENCOUNTER — Other Ambulatory Visit: Payer: Self-pay | Admitting: Family Medicine

## 2017-09-19 ENCOUNTER — Other Ambulatory Visit: Payer: Self-pay | Admitting: Neurology

## 2017-09-19 LAB — CYTOLOGY - PAP
DIAGNOSIS: NEGATIVE
HPV (WINDOPATH): NOT DETECTED

## 2017-09-20 DIAGNOSIS — S62101D Fracture of unspecified carpal bone, right wrist, subsequent encounter for fracture with routine healing: Secondary | ICD-10-CM | POA: Diagnosis not present

## 2017-09-25 DIAGNOSIS — S62101D Fracture of unspecified carpal bone, right wrist, subsequent encounter for fracture with routine healing: Secondary | ICD-10-CM | POA: Diagnosis not present

## 2017-09-27 DIAGNOSIS — S62101D Fracture of unspecified carpal bone, right wrist, subsequent encounter for fracture with routine healing: Secondary | ICD-10-CM | POA: Diagnosis not present

## 2017-10-02 MED FILL — PANTOPRAZOLE SOD DR 40 MG T: 40 | 30 days supply | Qty: 30 | Fill #2

## 2017-10-02 MED FILL — LISINOPRIL 10 MG TABS: 10 | 30 days supply | Qty: 30 | Fill #0

## 2017-10-03 DIAGNOSIS — S62101D Fracture of unspecified carpal bone, right wrist, subsequent encounter for fracture with routine healing: Secondary | ICD-10-CM | POA: Diagnosis not present

## 2017-10-06 MED FILL — DESVENLAFAXINE SUC ER 100 M: 100 | 90 days supply | Qty: 90 | Fill #1

## 2017-10-06 MED FILL — BUPROPION HCL XL 300 MG TAB: 300 | 90 days supply | Qty: 90 | Fill #1

## 2017-10-19 ENCOUNTER — Telehealth: Payer: Self-pay | Admitting: Family Medicine

## 2017-10-19 ENCOUNTER — Encounter: Payer: Self-pay | Admitting: General Practice

## 2017-10-19 NOTE — Telephone Encounter (Signed)
Letter emailed to patient as requested.

## 2017-10-19 NOTE — Telephone Encounter (Signed)
Letter printed, I will have tabori sign and give to angela to email.

## 2017-10-19 NOTE — Telephone Encounter (Signed)
Patient requesting documentation that she received her flu vaccine this year emailed to her at [email or web address]

## 2017-10-20 ENCOUNTER — Encounter: Payer: Self-pay | Admitting: Family Medicine

## 2017-10-20 DIAGNOSIS — S62101D Fracture of unspecified carpal bone, right wrist, subsequent encounter for fracture with routine healing: Secondary | ICD-10-CM | POA: Diagnosis not present

## 2017-10-23 DIAGNOSIS — M65321 Trigger finger, right index finger: Secondary | ICD-10-CM | POA: Diagnosis not present

## 2017-10-23 DIAGNOSIS — M25531 Pain in right wrist: Secondary | ICD-10-CM | POA: Diagnosis not present

## 2017-10-23 DIAGNOSIS — S62101D Fracture of unspecified carpal bone, right wrist, subsequent encounter for fracture with routine healing: Secondary | ICD-10-CM | POA: Diagnosis not present

## 2017-10-24 ENCOUNTER — Other Ambulatory Visit: Payer: Self-pay | Admitting: Physician Assistant

## 2017-10-24 ENCOUNTER — Ambulatory Visit (INDEPENDENT_AMBULATORY_CARE_PROVIDER_SITE_OTHER): Payer: 59 | Admitting: Family Medicine

## 2017-10-24 ENCOUNTER — Encounter: Payer: Self-pay | Admitting: Family Medicine

## 2017-10-24 VITALS — BP 123/84 | HR 72 | Temp 98.2°F | Resp 16 | Ht 63.0 in | Wt 148.1 lb

## 2017-10-24 DIAGNOSIS — R300 Dysuria: Secondary | ICD-10-CM | POA: Diagnosis not present

## 2017-10-24 DIAGNOSIS — R319 Hematuria, unspecified: Secondary | ICD-10-CM

## 2017-10-24 DIAGNOSIS — G2581 Restless legs syndrome: Secondary | ICD-10-CM

## 2017-10-24 HISTORY — DX: Restless legs syndrome: G25.81

## 2017-10-24 LAB — POCT URINALYSIS DIPSTICK
BILIRUBIN UA: NEGATIVE
GLUCOSE UA: NEGATIVE
KETONES UA: NEGATIVE
LEUKOCYTES UA: NEGATIVE
Nitrite, UA: NEGATIVE
PH UA: 6 (ref 5.0–8.0)
Protein, UA: NEGATIVE
Spec Grav, UA: 1.01 (ref 1.010–1.025)
Urobilinogen, UA: 0.2 E.U./dL

## 2017-10-24 MED ORDER — CIPROFLOXACIN HCL 500 MG PO TABS: 500.0000 mg | ORAL_TABLET | Freq: Two times a day (BID) | ORAL | 0 refills | Status: DC

## 2017-10-24 MED ORDER — ROPINIROLE HCL 0.5 MG PO TABS: 0.5000 mg | ORAL_TABLET | Freq: Every day | ORAL | 3 refills | Status: DC

## 2017-10-24 MED FILL — rOPINIRole HCL 0.5 MG TABS: 0.5 | 30 days supply | Qty: 30 | Fill #0

## 2017-10-24 MED FILL — CIPROFLOXACIN HCL 500 MG TA: 500 | 3 days supply | Qty: 6 | Fill #0

## 2017-10-24 NOTE — Progress Notes (Signed)
   Subjective:    Patient ID: Audrey Baxter, female    DOB: 05/16/1956, 61 y.o.   MRN: [id number]  HPI RLS- pt reports that 'for the last week, every time I get in bed it's like i'm going to come out of my skin.  My legs want to walk somewhere'.  Tried soap on her feet and mustard before bed w/o relief.  No improvement w/ Ambien.  Has not taken Clonazepam recently.    UTI- sxs started Sunday w/ urinary frequency.  No dysuria.  + urgency.  No suprapubic pain or pressure.  No fevers or CVA tenderness.   Review of Systems For ROS see HPI     Objective:   Physical Exam  Constitutional: She is oriented to person, place, and time. She appears well-developed and well-nourished. No distress.  Abdominal: Soft. She exhibits no distension. There is no tenderness (no suprapubic or CVA tenderness).  Neurological: She is alert and oriented to person, place, and time.  Skin: Skin is warm and dry.  Psychiatric: She has a normal mood and affect. Her behavior is normal. Thought content normal.  Vitals reviewed.         Assessment & Plan:  Urinary frequency- new.  Pt's sxs and PE are suspicious for infxn.  Start Cipro.  Reviewed supportive care and red flags that should prompt return.  Pt expressed understanding and is in agreement w/ plan.

## 2017-10-24 NOTE — Telephone Encounter (Signed)
Will defer further refills of patient's medications to PCP  

## 2017-10-24 NOTE — Patient Instructions (Signed)
Follow up in 1 month to recheck restless leg (RLS) Start the Cipro twice daily x3 days for the UTI Drink plenty of fluids Once you are done w/ Cipro, start the Ropinerole ~1 hr prior to bed.  Start w/ 1/2 tab x4 days and then increase to 1 tab nightly Call with any questions or concerns Hang in there!!!

## 2017-10-24 NOTE — Assessment & Plan Note (Signed)
New.  Pt reports for the last week she has been unable to lie down w/o the need to move her legs.  Since she is not taking her Clonazepam, will start low dose Requip.  If no improvement, will enlist help of Dr Tat to control sxs.  Pt expressed understanding and is in agreement w/ plan.

## 2017-10-25 ENCOUNTER — Ambulatory Visit (INDEPENDENT_AMBULATORY_CARE_PROVIDER_SITE_OTHER)
Admission: RE | Admit: 2017-10-25 | Discharge: 2017-10-25 | Disposition: A | Discharge status: Home / Self Care | Payer: 59 | Source: Ambulatory Visit | Attending: Family Medicine | Admitting: Family Medicine

## 2017-10-25 DIAGNOSIS — E2839 Other primary ovarian failure: Secondary | ICD-10-CM

## 2017-10-25 MED FILL — ROSUVASTATIN CALCIUM 10 MG: 10 | 90 days supply | Qty: 90 | Fill #0

## 2017-10-26 ENCOUNTER — Encounter: Payer: Self-pay | Admitting: General Practice

## 2017-10-27 LAB — URINE CULTURE
MICRO NUMBER: 81246680
SPECIMEN QUALITY:: ADEQUATE

## 2017-10-27 NOTE — Progress Notes (Signed)
Called pt and lmovm to return call.

## 2017-11-08 ENCOUNTER — Telehealth: Payer: Self-pay | Admitting: *Deleted

## 2017-11-08 DIAGNOSIS — S52571G Other intraarticular fracture of lower end of right radius, subsequent encounter for closed fracture with delayed healing: Secondary | ICD-10-CM

## 2017-11-08 NOTE — Telephone Encounter (Signed)
[place] for referral to [facility]

## 2017-11-08 NOTE — Telephone Encounter (Signed)
Patient informed that referral has placed.

## 2017-11-08 NOTE — Telephone Encounter (Signed)
Okay for referral?  Copied from [place] [id number]. Topic: Referral - Request >> Nov 08, 2017  8:41 AM Aurelio Brash B wrote: Reason for CRM:  She has been going to PT for wrist after surgery, now wants a referral to [facility] [place]   for PT there

## 2017-11-13 ENCOUNTER — Ambulatory Visit: Payer: 59 | Admitting: Physical Therapy

## 2017-11-13 ENCOUNTER — Encounter: Payer: Self-pay | Admitting: Physical Therapy

## 2017-11-13 ENCOUNTER — Other Ambulatory Visit: Payer: Self-pay

## 2017-11-13 ENCOUNTER — Ambulatory Visit: Payer: 59

## 2017-11-13 DIAGNOSIS — M25631 Stiffness of right wrist, not elsewhere classified: Secondary | ICD-10-CM | POA: Diagnosis not present

## 2017-11-13 DIAGNOSIS — M6281 Muscle weakness (generalized): Secondary | ICD-10-CM | POA: Diagnosis not present

## 2017-11-13 DIAGNOSIS — M25531 Pain in right wrist: Secondary | ICD-10-CM | POA: Diagnosis not present
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
## 2017-11-13 NOTE — Patient Instructions (Signed)
Towel Roll Squeeze    With right forearm resting on surface, gently squeeze towel.  Hold for 3-5 seconds. Repeat __10-15__ times per set. Do _1___ sets per session. Do __2-3__ sessions per day.   Pronation / Supination (Resistive)    Hold hammer weighing __1-2_ pounds and rotate palm up and down. Keep elbow flexed at side and wrist straight.  Hold each position for 10-15 seconds for added stretch. Repeat __10-15__ times. Do _2-3___ sessions per day.    Do your prayer and reverse prayer stretch.  Make sure to keep your hands together.  Hold each position for 30 seconds.  Repeat each stretch 2-3 times.  Do 2-3 sessions per day.

## 2017-11-20 DIAGNOSIS — M25531 Pain in right wrist: Secondary | ICD-10-CM | POA: Diagnosis not present

## 2017-11-20 MED FILL — PANTOPRAZOLE SOD DR 40 MG T: 40 | 30 days supply | Qty: 30 | Fill #3

## 2017-11-20 MED FILL — rOPINIRole HCL 0.5 MG TABS: 0.5 | 30 days supply | Qty: 30 | Fill #1

## 2017-11-20 MED FILL — ARIPiprazole 10 MG TABS: 10 | 90 days supply | Qty: 90 | Fill #0

## 2017-11-21 ENCOUNTER — Ambulatory Visit: Payer: 59 | Admitting: Physical Therapy

## 2017-11-21 ENCOUNTER — Encounter: Payer: Self-pay | Admitting: Physical Therapy

## 2017-11-21 DIAGNOSIS — M6281 Muscle weakness (generalized): Secondary | ICD-10-CM | POA: Diagnosis not present

## 2017-11-21 DIAGNOSIS — M25531 Pain in right wrist: Secondary | ICD-10-CM | POA: Diagnosis not present

## 2017-11-21 DIAGNOSIS — M25631 Stiffness of right wrist, not elsewhere classified: Secondary | ICD-10-CM | POA: Diagnosis not present

## 2017-11-21 MED FILL — ZOLPIDEM TARTRATE 10 MG TAB: 10 | 30 days supply | Qty: 30 | Fill #0
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
## 2017-11-21 NOTE — Therapy (Signed)
[facility] [address] [place], [place], [address] Phone: [phone]   Fax:  [phone]  Physical Therapy Treatment  Patient Details  Name: Audrey Baxter MRN: [id number] Date of Birth: Mar 21, 1956 Referring Provider: Dr. Dimple Nanas / Surgeon: Amedeo Plenty   Encounter Date: 11/21/2017  PT End of Session - 11/21/17 1013    Visit Number  2    Number of Visits  12    Date for PT Re-Evaluation  12/25/17    Authorization Type  [employer]    PT Start Time  0931    PT Stop Time  1012    PT Time Calculation (min)  41 min    Activity Tolerance  Patient tolerated treatment well    Behavior During Therapy  [facility] for tasks assessed/performed       Past Medical History:  Diagnosis Date  . Anxiety   . Arthritis   . Cancer ([place])    cervical/melanoma  . Depression   . GERD (gastroesophageal reflux disease)   . Heart murmur   . History of colon polyps   . Hyperlipidemia   . Hypertension   . Osteoporosis   . Radial fracture    Right  . Tremor     Past Surgical History:  Procedure Laterality Date  . BREAST SURGERY     Left lumpectomy  . BUNIONECTOMY Bilateral   . CARDIAC CATHETERIZATION     15 years ago  . OPEN REDUCTION INTERNAL FIXATION (ORIF) DISTAL RADIAL FRACTURE Right 08/19/2017   Procedure: OPEN REDUCTION INTERNAL FIXATION (ORIF) DISTAL RADIAL FRACTURE WITH REPAIR RECONSTRUCTION, ALLOGRAFT BONE GRAFT AS NECESSARY;  Surgeon: Roseanne Kaufman, MD;  Location: [facility];  Service: Orthopedics;  Laterality: Right;  90 MINS  . WRIST ARTHROPLASTY  2007    There were no vitals filed for this visit.  Subjective Assessment - 11/21/17 0956    Subjective  doing well; ROM seems to be improved    Patient Stated Goals  improve ROM    Currently in Pain?  No/denies                      [facility] Adult PT Treatment/Exercise - 11/21/17 0956      Elbow Exercises   Forearm Supination  Right;5 reps;Seated;Bar weights/barbell    Forearm  Supination Limitations  with end range hold x 15 sec    Forearm Pronation  Right;5 reps;Seated;Bar weights/barbell    Forearm Pronation Limitations  with end range hold x 15 sec    Wrist Flexion  Self ROM;PROM;Both    Wrist Flexion Limitations  reverse prayer stretch 2x30 sec for ROM    Wrist Extension  Self ROM;PROM;Both    Wrist Extension Limitations  prayer stretch 2x30 sec for ROM      Hand Exercises   Other Hand Exercises  towel squeeze - instructed to use stress ball at home      Wrist Exercises   Bar Weights/Barbell (Forearm Supination)  2 lbs    Bar Weights/Barbell (Forearm Pronation)  2 lbs      Manual Therapy   Manual Therapy  Joint mobilization;Passive ROM    Joint Mobilization  Rt wrist/radius/ulna grades 2-3    Passive ROM  Rt wrist all motions including pronation/supination                  PT Long Term Goals - 11/13/17 1446      PT LONG TERM GOAL #1   Title  independent with  HEP    Status  New    Target Date  12/25/17      PT LONG TERM GOAL #2   Title  improve Rt wrist active flexion to at least 35 degrees in order to play flute with less difficulty    Status  New    Target Date  12/25/17      PT LONG TERM GOAL #3   Title  report pain < 2/10 with activity for improved function    Status  New    Target Date  12/25/17      PT LONG TERM GOAL #4   Title  demonstrate 4/5 strength in Rt wrist for improved function    Status  New    Target Date  12/25/17      PT LONG TERM GOAL #5   Title  improve Rt grip strength to at least 35# for improved strength    Status  New    Target Date  12/25/17            Plan - 11/21/17 1013    Clinical Impression Statement  Pt demonstrated improved passive wrist flexion to 75 degrees today and tolerated manual well.  Progressing well.    PT Treatment/Interventions  ADLs/Self Care Home Management;Cryotherapy;Electrical Stimulation;Moist Heat;Therapeutic exercise;Therapeutic activities;Ultrasound;Patient/family  education;Manual techniques;Vasopneumatic Device;Taping;Passive range of motion;Dry needling    PT Next Visit Plan  manual/joint mobs; review HEP and gentle strengthening exercises    Consulted and Agree with Plan of Care  Patient       Patient will benefit from skilled therapeutic intervention in order to improve the following deficits and impairments:  Pain, Impaired UE functional use, Decreased strength, Decreased range of motion, Postural dysfunction, Hypomobility, Increased fascial restricitons  Visit Diagnosis: Stiffness of right wrist, not elsewhere classified  Pain in right wrist  Muscle weakness (generalized)     Problem List Patient Active Problem List   Diagnosis Date Noted  . RLS (restless legs syndrome) 10/24/2017  . Pap smear for cervical cancer screening 09/15/2017  . Closed fracture of right distal radius 08/19/2017  . Physical exam 05/26/2017  . HTN (hypertension) 11/24/2016  . Hyperlipidemia 11/24/2016  . Memory loss 11/24/2016  . Major depression, recurrent ([place]) 11/24/2016  . Right knee pain 08/06/2013  . Hip pain, bilateral 11/16/2011  . Leg length inequality 11/16/2011      Laureen Abrahams, PT, DPT 11/21/17 10:14 AM     [facility] [place], [place], [address] Phone: [phone]   Fax:  [phone]  Name: TAHEERAH GULDIN MRN: [id number] Date of Birth: 07-26-1956

## 2017-11-23 ENCOUNTER — Ambulatory Visit: Payer: 59 | Admitting: Physical Therapy

## 2017-11-23 ENCOUNTER — Encounter: Payer: Self-pay | Admitting: Physical Therapy

## 2017-11-23 ENCOUNTER — Ambulatory Visit: Payer: 59 | Admitting: Neurology

## 2017-11-23 ENCOUNTER — Encounter: Payer: Self-pay | Admitting: Neurology

## 2017-11-23 VITALS — BP 132/94 | HR 88 | Ht 63.0 in | Wt 154.0 lb

## 2017-11-23 DIAGNOSIS — E538 Deficiency of other specified B group vitamins: Secondary | ICD-10-CM

## 2017-11-23 DIAGNOSIS — M6281 Muscle weakness (generalized): Secondary | ICD-10-CM | POA: Diagnosis not present

## 2017-11-23 DIAGNOSIS — M25631 Stiffness of right wrist, not elsewhere classified: Secondary | ICD-10-CM

## 2017-11-23 DIAGNOSIS — G2581 Restless legs syndrome: Secondary | ICD-10-CM | POA: Diagnosis not present

## 2017-11-23 DIAGNOSIS — G250 Essential tremor: Secondary | ICD-10-CM | POA: Diagnosis not present

## 2017-11-23 DIAGNOSIS — M25531 Pain in right wrist: Secondary | ICD-10-CM | POA: Diagnosis not present

## 2017-11-23 MED ORDER — PRIMIDONE 50 MG PO TABS: 100.0000 mg | ORAL_TABLET | Freq: Two times a day (BID) | ORAL | 1 refills | Status: DC

## 2017-11-23 MED FILL — PRIMIDONE 50 MG TABLET: 50 | 90 days supply | Qty: 360 | Fill #0

## 2017-11-23 MED FILL — traZODone HCL 100 MG TABS: 100 | 90 days supply | Qty: 270 | Fill #0

## 2017-11-23 NOTE — Patient Instructions (Signed)
Wrist Flexion: Resisted    With right palm up, __1-2__ pound weight in hand, bend wrist up. Return slowly. Repeat __20__ times per set. Do __1__ sets per session. Do __1-2__ sessions per day.   Wrist Extension: Resisted    With right palm down, __1-2__ pound weight in hand, bend wrist up. Return slowly. Repeat _20___ times per set. Do __1__ sets per session. Do _1-2___ sessions per day.  Wrist Radial Deviation: Resisted    With right thumb up, __1-2__ pound weight in hand, bend wrist up. Return slowly. Repeat _20___ times per set. Do ___1_ sets per session. Do __1-2__ sessions per day.    Trigger Point Dry Needling  . What is Trigger Point Dry Needling (DN)? o DN is a physical therapy technique used to treat muscle pain and dysfunction. Specifically, DN helps deactivate muscle trigger points (muscle knots).  o A thin filiform needle is used to penetrate the skin and stimulate the underlying trigger point. The goal is for a local twitch response (LTR) to occur and for the trigger point to relax. No medication of any kind is injected during the procedure.   . What Does Trigger Point Dry Needling Feel Like?  o The procedure feels different for each individual patient. Some patients report that they do not actually feel the needle enter the skin and overall the process is not painful. Very mild bleeding may occur. However, many patients feel a deep cramping in the muscle in which the needle was inserted. This is the local twitch response.   Marland Kitchen How Will I feel after the treatment? o Soreness is normal, and the onset of soreness may not occur for a few hours. Typically this soreness does not last longer than two days.  o Bruising is uncommon, however; ice can be used to decrease any possible bruising.  o In rare cases feeling tired or nauseous after the treatment is normal. In addition, your symptoms may get worse before they get better, this period will typically not last longer than 24  hours.   . What Can I do After My Treatment? o Increase your hydration by drinking more water for the next 24 hours. o You may place ice or heat on the areas treated that have become sore, however, do not use heat on inflamed or bruised areas. Heat often brings more relief post needling. o You can continue your regular activities, but vigorous activity is not recommended initially after the treatment for 24 hours. o DN is best combined with other physical therapy such as strengthening, stretching, and other therapies.

## 2017-11-23 NOTE — Patient Instructions (Signed)
1. Stop Requip  2. Start Clonazepam 0.5 mg - 1/2 tablet at bedtime.   3. Restart Primidone 50 mg - 1 tablet twice daily for one week,  Then 2 tablets twice daily. Stay at this dose for now.

## 2017-11-23 NOTE — Therapy (Signed)
[facility] [address] [place], [place], [address] Phone: [phone]   Fax:  [phone]  Physical Therapy Treatment  Patient Details  Name: Audrey Baxter MRN: [id number] Date of Birth: 12/03/1956 Referring Provider: Dr. Dimple Nanas / Surgeon: Amedeo Plenty   Encounter Date: 11/23/2017  PT End of Session - 11/23/17 1013    Visit Number  3    Number of Visits  12    Date for PT Re-Evaluation  12/25/17    Authorization Type  [facility] [employer]    PT Start Time  0930    PT Stop Time  1011    PT Time Calculation (min)  41 min    Activity Tolerance  Patient tolerated treatment well    Behavior During Therapy  [facility] for tasks assessed/performed       Past Medical History:  Diagnosis Date  . Anxiety   . Arthritis   . Cancer ([place])    cervical/melanoma  . Depression   . GERD (gastroesophageal reflux disease)   . Heart murmur   . History of colon polyps   . Hyperlipidemia   . Hypertension   . Osteoporosis   . Radial fracture    Right  . Tremor     Past Surgical History:  Procedure Laterality Date  . BREAST SURGERY     Left lumpectomy  . BUNIONECTOMY Bilateral   . CARDIAC CATHETERIZATION     15 years ago  . OPEN REDUCTION INTERNAL FIXATION (ORIF) DISTAL RADIAL FRACTURE Right 08/19/2017   Procedure: OPEN REDUCTION INTERNAL FIXATION (ORIF) DISTAL RADIAL FRACTURE WITH REPAIR RECONSTRUCTION, ALLOGRAFT BONE GRAFT AS NECESSARY;  Surgeon: Roseanne Kaufman, MD;  Location: [facility];  Service: Orthopedics;  Laterality: Right;  90 MINS  . WRIST ARTHROPLASTY  2007    There were no vitals filed for this visit.  Subjective Assessment - 11/23/17 0929    Subjective  wrist is a little painful; busy all day yesterday and only able to get one set of exercises.      Patient Stated Goals  improve ROM    Currently in Pain?  Yes    Pain Score  2     Pain Location  Wrist    Pain Orientation  Right    Pain Descriptors / Indicators  Aching;Sore    Pain Type   Acute pain;Surgical pain    Pain Onset  More than a month ago    Pain Frequency  Intermittent    Aggravating Factors   trying to sleep, cold, typing    Pain Relieving Factors  advil, ice                      [facility] Adult PT Treatment/Exercise - 11/23/17 0933      Wrist Exercises   Forearm Supination  Right;10 reps;Seated    Bar Weights/Barbell (Forearm Supination)  2 lbs    Forearm Pronation  Right;10 reps;Seated    Bar Weights/Barbell (Forearm Pronation)  2 lbs    Wrist Flexion  Right;20 reps;Seated;Bar weights/barbell    Bar Weights/Barbell (Wrist Flexion)  1 lb;2 lbs    Wrist Extension  Right;20 reps;Seated;Bar weights/barbell    Bar Weights/Barbell (Wrist Extension)  1 lb;2 lbs      Manual Therapy   Manual Therapy  Joint mobilization;Passive ROM    Joint Mobilization  Rt wrist/radius/ulna grades 2-3    Passive ROM  Rt wrist all motions including pronation/supination  PT Education - 11/23/17 1012    Education provided  Yes    Education Details  strengthening HEP, DN    Person(s) Educated  Patient    Methods  Explanation;Demonstration;Handout    Comprehension  Verbalized understanding;Returned demonstration          PT Long Term Goals - 11/13/17 1446      PT LONG TERM GOAL #1   Title  independent with HEP    Status  New    Target Date  12/25/17      PT LONG TERM GOAL #2   Title  improve Rt wrist active flexion to at least 35 degrees in order to play flute with less difficulty    Status  New    Target Date  12/25/17      PT LONG TERM GOAL #3   Title  report pain < 2/10 with activity for improved function    Status  New    Target Date  12/25/17      PT LONG TERM GOAL #4   Title  demonstrate 4/5 strength in Rt wrist for improved function    Status  New    Target Date  12/25/17      PT LONG TERM GOAL #5   Title  improve Rt grip strength to at least 35# for improved strength    Status  New    Target Date  12/25/17             Plan - 11/23/17 1144    Clinical Impression Statement  Pt tolerated session well today and able to add additional strengthening exercises to her program today.  Active trigger points noted in all wrist muscles and feel she will benefit from DN to address this.  Message sent to Dr. Amedeo Plenty for clearance to proceed with DN.      PT Treatment/Interventions  ADLs/Self Care Home Management;Cryotherapy;Electrical Stimulation;Moist Heat;Therapeutic exercise;Therapeutic activities;Ultrasound;Patient/family education;Manual techniques;Vasopneumatic Device;Taping;Passive range of motion;Dry needling    PT Next Visit Plan  manual/joint mobs; review HEP and gentle strengthening exercises    Consulted and Agree with Plan of Care  Patient       Patient will benefit from skilled therapeutic intervention in order to improve the following deficits and impairments:  Pain, Impaired UE functional use, Decreased strength, Decreased range of motion, Postural dysfunction, Hypomobility, Increased fascial restricitons  Visit Diagnosis: Stiffness of right wrist, not elsewhere classified  Pain in right wrist  Muscle weakness (generalized)     Problem List Patient Active Problem List   Diagnosis Date Noted  . RLS (restless legs syndrome) 10/24/2017  . Pap smear for cervical cancer screening 09/15/2017  . Closed fracture of right distal radius 08/19/2017  . Physical exam 05/26/2017  . HTN (hypertension) 11/24/2016  . Hyperlipidemia 11/24/2016  . Memory loss 11/24/2016  . Major depression, recurrent ([place]) 11/24/2016  . Right knee pain 08/06/2013  . Hip pain, bilateral 11/16/2011  . Leg length inequality 11/16/2011      Laureen Abrahams, PT, DPT 11/23/17 11:49 AM     [facility] [address] [place], [place], [address] Phone: [phone]   Fax:  [phone]  Name: Audrey Baxter MRN: [id number] Date of Birth: [birth year]

## 2017-11-30 ENCOUNTER — Ambulatory Visit: Payer: 59 | Admitting: Physical Therapy

## 2017-11-30 ENCOUNTER — Telehealth: Payer: Self-pay | Admitting: Family Medicine

## 2017-11-30 ENCOUNTER — Encounter: Payer: Self-pay | Admitting: Physical Therapy

## 2017-11-30 DIAGNOSIS — M6281 Muscle weakness (generalized): Secondary | ICD-10-CM

## 2017-11-30 DIAGNOSIS — M25631 Stiffness of right wrist, not elsewhere classified: Secondary | ICD-10-CM

## 2017-11-30 DIAGNOSIS — M25531 Pain in right wrist: Secondary | ICD-10-CM

## 2017-11-30 NOTE — Telephone Encounter (Signed)
Copied from [place] [id number]. Topic: Quick Communication - Office Called Patient >> Nov 30, 2017 11:22 AM Cecelia Byars, NT wrote: Reason for CRM: Patient says office called and did not leave a message please call again  [phone]

## 2017-11-30 NOTE — Telephone Encounter (Signed)
It was another office that called patient, not the [employer].  Please see CRM from today that was created by the [place] office

## 2017-11-30 NOTE — Therapy (Signed)
[facility] [address] [place], [place], [address] Phone: [phone]   Fax:  [phone]  Physical Therapy Treatment  Patient Details  Name: Audrey Baxter MRN: [id number] Date of Birth: [birth year] Referring Provider: Dr. Dimple Nanas / Surgeon: Amedeo Plenty   Encounter Date: 11/30/2017  PT End of Session - 11/30/17 1011    Visit Number  4    Number of Visits  12    Date for PT Re-Evaluation  12/25/17    Authorization Type  [facility] U[employer]    PT Start Time  0930    PT Stop Time  1011    PT Time Calculation (min)  41 min    Activity Tolerance  Patient tolerated treatment well    Behavior During Therapy  [facility] for tasks assessed/performed       Past Medical History:  Diagnosis Date  . Anxiety   . Arthritis   . Cancer ([place])    cervical/melanoma  . Depression   . GERD (gastroesophageal reflux disease)   . Heart murmur   . History of colon polyps   . Hyperlipidemia   . Hypertension   . Osteoporosis   . Radial fracture    Right  . Tremor     Past Surgical History:  Procedure Laterality Date  . BREAST SURGERY     Left lumpectomy  . BUNIONECTOMY Bilateral   . CARDIAC CATHETERIZATION     15 years ago  . OPEN REDUCTION INTERNAL FIXATION (ORIF) DISTAL RADIAL FRACTURE Right 08/19/2017   Procedure: OPEN REDUCTION INTERNAL FIXATION (ORIF) DISTAL RADIAL FRACTURE WITH REPAIR RECONSTRUCTION, ALLOGRAFT BONE GRAFT AS NECESSARY;  Surgeon: Roseanne Kaufman, MD;  Location: [facility];  Service: Orthopedics;  Laterality: Right;  90 MINS  . WRIST ARTHROPLASTY  2007    There were no vitals filed for this visit.  Subjective Assessment - 11/30/17 0928    Subjective  doing well; wrist has been aching but otherwise doing well    Patient Stated Goals  improve ROM    Currently in Pain?  Yes    Pain Score  1     Pain Location  Wrist    Pain Orientation  Right    Pain Descriptors / Indicators  Aching    Pain Type  Acute pain;Surgical pain    Pain Onset   More than a month ago    Pain Frequency  Intermittent    Aggravating Factors   trying to sleep, cold, typing    Pain Relieving Factors  advil, ice                      [facility] Adult PT Treatment/Exercise - 11/30/17 1010      Manual Therapy   Manual Therapy  Joint mobilization;Passive ROM    Joint Mobilization  Rt wrist/radius/ulna grades 2-3    Passive ROM  Rt wrist all motions including pronation/supination       Trigger Point Dry Needling - 11/30/17 1011    Consent Given?  Yes    Education Handout Provided  Yes    Muscles Treated Upper Body  -- wrist extensors: twitch responses                PT Long Term Goals - 11/13/17 1446      PT LONG TERM GOAL #1   Title  independent with HEP    Status  New    Target Date  12/25/17      PT LONG TERM GOAL #  2   Title  improve Rt wrist active flexion to at least 35 degrees in order to play flute with less difficulty    Status  New    Target Date  12/25/17      PT LONG TERM GOAL #3   Title  report pain < 2/10 with activity for improved function    Status  New    Target Date  12/25/17      PT LONG TERM GOAL #4   Title  demonstrate 4/5 strength in Rt wrist for improved function    Status  New    Target Date  12/25/17      PT LONG TERM GOAL #5   Title  improve Rt grip strength to at least 35# for improved strength    Status  New    Target Date  12/25/17            Plan - 11/30/17 1012    Clinical Impression Statement  Pt tolerated DN to wrist extensors well today with good twitch responses noted (given okay by Dr. Amedeo Plenty).  Session today consisted of aggressive manual therapy to work on ROM following DN.  Progressing well with PT.    PT Treatment/Interventions  ADLs/Self Care Home Management;Cryotherapy;Electrical Stimulation;Moist Heat;Therapeutic exercise;Therapeutic activities;Ultrasound;Patient/family education;Manual techniques;Vasopneumatic Device;Taping;Passive range of motion;Dry needling    PT  Next Visit Plan  manual/joint mobs; review HEP and gentle strengthening exercises; assess response to DN    Consulted and Agree with Plan of Care  Patient       Patient will benefit from skilled therapeutic intervention in order to improve the following deficits and impairments:  Pain, Impaired UE functional use, Decreased strength, Decreased range of motion, Postural dysfunction, Hypomobility, Increased fascial restricitons  Visit Diagnosis: Stiffness of right wrist, not elsewhere classified  Pain in right wrist  Muscle weakness (generalized)     Problem List Patient Active Problem List   Diagnosis Date Noted  . RLS (restless legs syndrome) 10/24/2017  . Pap smear for cervical cancer screening 09/15/2017  . Closed fracture of right distal radius 08/19/2017  . Physical exam 05/26/2017  . HTN (hypertension) 11/24/2016  . Hyperlipidemia 11/24/2016  . Memory loss 11/24/2016  . Major depression, recurrent ([place]) 11/24/2016  . Right knee pain 08/06/2013  . Hip pain, bilateral 11/16/2011  . Leg length inequality 11/16/2011      Laureen Abrahams, PT, DPT 11/30/17 10:14 AM    [facility] [place], [place], [address] Phone: [phone]   Fax:  [phone]  Name: Audrey Baxter MRN: [id number] Date of Birth: [birth year]

## 2017-12-04 ENCOUNTER — Other Ambulatory Visit: Payer: Self-pay | Admitting: Family Medicine

## 2017-12-04 DIAGNOSIS — Z1231 Encounter for screening mammogram for malignant neoplasm of breast: Secondary | ICD-10-CM

## 2017-12-05 ENCOUNTER — Encounter: Payer: Self-pay | Admitting: Physical Therapy

## 2017-12-05 ENCOUNTER — Ambulatory Visit: Payer: 59 | Admitting: Physical Therapy

## 2017-12-05 DIAGNOSIS — M25631 Stiffness of right wrist, not elsewhere classified: Secondary | ICD-10-CM

## 2017-12-05 DIAGNOSIS — M6281 Muscle weakness (generalized): Secondary | ICD-10-CM

## 2017-12-05 DIAGNOSIS — M25531 Pain in right wrist: Secondary | ICD-10-CM | POA: Diagnosis not present

## 2017-12-05 NOTE — Therapy (Signed)
[facility] [address] [place], [place], [address] Phone: [phone]   Fax:  [phone]  Physical Therapy Treatment  Patient Details  Name: Audrey Baxter MRN: [id number] Date of Birth: [birth year] Referring Provider: Dr. Dimple Nanas / Surgeon: Amedeo Plenty   Encounter Date: 12/05/2017  PT End of Session - 12/05/17 1012    Visit Number  5    Number of Visits  12    Date for PT Re-Evaluation  12/25/17    Authorization Type  [employer]    PT Start Time  0927    PT Stop Time  1011    PT Time Calculation (min)  44 min    Activity Tolerance  Patient tolerated treatment well    Behavior During Therapy  [facility] for tasks assessed/performed       Past Medical History:  Diagnosis Date  . Anxiety   . Arthritis   . Cancer ([facility])    cervical/melanoma  . Depression   . GERD (gastroesophageal reflux disease)   . Heart murmur   . History of colon polyps   . Hyperlipidemia   . Hypertension   . Osteoporosis   . Radial fracture    Right  . Tremor     Past Surgical History:  Procedure Laterality Date  . BREAST SURGERY     Left lumpectomy  . BUNIONECTOMY Bilateral   . CARDIAC CATHETERIZATION     15 years ago  . OPEN REDUCTION INTERNAL FIXATION (ORIF) DISTAL RADIAL FRACTURE Right 08/19/2017   Procedure: OPEN REDUCTION INTERNAL FIXATION (ORIF) DISTAL RADIAL FRACTURE WITH REPAIR RECONSTRUCTION, ALLOGRAFT BONE GRAFT AS NECESSARY;  Surgeon: Roseanne Kaufman, MD;  Location: [facility];  Service: Orthopedics;  Laterality: Right;  90 MINS  . WRIST ARTHROPLASTY  2007    There were no vitals filed for this visit.  Subjective Assessment - 12/05/17 0929    Subjective  feels like motion is getting better, DN seemed to be beneficial    Patient Stated Goals  improve ROM    Currently in Pain?  No/denies         [facility] PT Assessment - 12/05/17 0954      AROM   Right Wrist Extension  50 Degrees    Right Wrist Flexion  46 Degrees    Right Wrist Radial  Deviation  23 Degrees    Right Wrist Ulnar Deviation  32 Degrees                  [facility] Adult PT Treatment/Exercise - 12/05/17 0001      Elbow Exercises   Wrist Flexion  Right;20 reps;Seated;Bar weights/barbell    Bar Weights/Barbell (Wrist Flexion)  2 lbs    Wrist Extension  Right;20 reps;Seated;Bar weights/barbell    Bar Weights/Barbell (Wrist Extension)  2 lbs      Hand Exercises   Other Hand Exercises  standing weight shifting through wrist      Wrist Exercises   Other wrist exercises  standing radial/ulnar devaition against wall for resistance to simulate flute playing      Manual Therapy   Manual Therapy  Joint mobilization;Passive ROM    Joint Mobilization  Rt wrist/radius/ulna grades 2-3    Passive ROM  Rt wrist all motions including pronation/supination       Trigger Point Dry Needling - 12/05/17 1011    Consent Given?  Yes    Muscles Treated Upper Body  -- wrist extensors/flexors: twitch responses  PT Education - 12/05/17 1011    Education provided  Yes    Education Details  radial/ulnar devation standing against wall for some resistance and to simulate flute playing    Person(s) Educated  Patient    Methods  Explanation;Demonstration    Comprehension  Verbalized understanding;Returned demonstration          PT Long Term Goals - 11/13/17 1446      PT LONG TERM GOAL #1   Title  independent with HEP    Status  New    Target Date  12/25/17      PT LONG TERM GOAL #2   Title  improve Rt wrist active flexion to at least 35 degrees in order to play flute with less difficulty    Status  New    Target Date  12/25/17      PT LONG TERM GOAL #3   Title  report pain < 2/10 with activity for improved function    Status  New    Target Date  12/25/17      PT LONG TERM GOAL #4   Title  demonstrate 4/5 strength in Rt wrist for improved function    Status  New    Target Date  12/25/17      PT LONG TERM GOAL #5   Title  improve Rt grip  strength to at least 35# for improved strength    Status  New    Target Date  12/25/17            Plan - 12/05/17 1012    Clinical Impression Statement  Pt demonstrated improvements in Rt wrist ROM especially flexion and radial devation.  Continues to report some ROM limitations and functional strength deficits.  Progressing well with PT.    PT Treatment/Interventions  ADLs/Self Care Home Management;Cryotherapy;Electrical Stimulation;Moist Heat;Therapeutic exercise;Therapeutic activities;Ultrasound;Patient/family education;Manual techniques;Vasopneumatic Device;Taping;Passive range of motion;Dry needling    PT Next Visit Plan  manual/joint mobs; review HEP and gentle strengthening exercises; assess response to DN    Consulted and Agree with Plan of Care  Patient       Patient will benefit from skilled therapeutic intervention in order to improve the following deficits and impairments:  Pain, Impaired UE functional use, Decreased strength, Decreased range of motion, Postural dysfunction, Hypomobility, Increased fascial restricitons  Visit Diagnosis: Stiffness of right wrist, not elsewhere classified  Pain in right wrist  Muscle weakness (generalized)     Problem List Patient Active Problem List   Diagnosis Date Noted  . RLS (restless legs syndrome) 10/24/2017  . Pap smear for cervical cancer screening 09/15/2017  . Closed fracture of right distal radius 08/19/2017  . Physical exam 05/26/2017  . HTN (hypertension) 11/24/2016  . Hyperlipidemia 11/24/2016  . Memory loss 11/24/2016  . Major depression, recurrent ([place]) 11/24/2016  . Right knee pain 08/06/2013  . Hip pain, bilateral 11/16/2011  . Leg length inequality 11/16/2011      Laureen Abrahams, PT, DPT 12/05/17 10:14 AM    [place], [place], [address] Phone: [phone]   Fax:  [phone]  Name: JERRILYN MESSINGER MRN:  [id number] Date of Birth: 1956-05-09

## 2017-12-07 ENCOUNTER — Encounter: Payer: Self-pay | Admitting: Physical Therapy

## 2017-12-07 ENCOUNTER — Ambulatory Visit: Payer: 59 | Admitting: Physical Therapy

## 2017-12-07 DIAGNOSIS — M25531 Pain in right wrist: Secondary | ICD-10-CM

## 2017-12-07 DIAGNOSIS — M25631 Stiffness of right wrist, not elsewhere classified: Secondary | ICD-10-CM | POA: Diagnosis not present

## 2017-12-07 DIAGNOSIS — M6281 Muscle weakness (generalized): Secondary | ICD-10-CM | POA: Diagnosis not present

## 2017-12-07 NOTE — Therapy (Signed)
[facility] [address] [place], [place], [address] Phone: [phone]   Fax:  [phone]  Physical Therapy Treatment  Patient Details  Name: Audrey Baxter MRN: [id number] Date of Birth: 08-Jun-1956 Referring Provider: Dr. Dimple Nanas / Surgeon: Amedeo Plenty   Encounter Date: 12/07/2017  PT End of Session - 12/07/17 1002    Visit Number  6    Number of Visits  12    Date for PT Re-Evaluation  12/25/17    Authorization Type  [facility] [employer]    PT Start Time  0927    PT Stop Time  1005    PT Time Calculation (min)  38 min    Activity Tolerance  Patient tolerated treatment well    Behavior During Therapy  [facility] for tasks assessed/performed       Past Medical History:  Diagnosis Date  . Anxiety   . Arthritis   . Cancer ([place])    cervical/melanoma  . Depression   . GERD (gastroesophageal reflux disease)   . Heart murmur   . History of colon polyps   . Hyperlipidemia   . Hypertension   . Osteoporosis   . Radial fracture    Right  . Tremor     Past Surgical History:  Procedure Laterality Date  . BREAST SURGERY     Left lumpectomy  . BUNIONECTOMY Bilateral   . CARDIAC CATHETERIZATION     15 years ago  . OPEN REDUCTION INTERNAL FIXATION (ORIF) DISTAL RADIAL FRACTURE Right 08/19/2017   Procedure: OPEN REDUCTION INTERNAL FIXATION (ORIF) DISTAL RADIAL FRACTURE WITH REPAIR RECONSTRUCTION, ALLOGRAFT BONE GRAFT AS NECESSARY;  Surgeon: Roseanne Kaufman, MD;  Location: [place] [facility];  Service: Orthopedics;  Laterality: Right;  90 MINS  . WRIST ARTHROPLASTY  2007    There were no vitals filed for this visit.  Subjective Assessment - 12/07/17 0929    Subjective  just got back from a private yoga session with instructor to see what she could do    Patient Stated Goals  improve ROM    Currently in Pain?  No/denies                      [facility] Adult PT Treatment/Exercise - 12/07/17 0931      Elbow Exercises   Wrist Flexion  Right;20  reps;Bar weights/barbell;Standing    Bar Weights/Barbell (Wrist Flexion)  2 lbs    Wrist Flexion Limitations  cues to decrease ulnar devation    Wrist Extension  Right;20 reps;Bar weights/barbell;Standing    Bar Weights/Barbell (Wrist Extension)  2 lbs      Hand Exercises   Other Hand Exercises  standing weight shifting through wrist      Wrist Exercises   Other wrist exercises  wrist extensor stretch 2x30 sec; Rt    Other wrist exercises  with Rt arm in flute playing position: radial/ulnar deviation x 20 reps each; holding end of 2# weight      Manual Therapy   Manual Therapy  Joint mobilization;Passive ROM    Joint Mobilization  Rt wrist/radius/ulna grades 2-3    Passive ROM  Rt wrist all motions including pronation/supination                  PT Long Term Goals - 11/13/17 1446      PT LONG TERM GOAL #1   Title  independent with HEP    Status  New    Target Date  12/25/17  PT LONG TERM GOAL #2   Title  improve Rt wrist active flexion to at least 35 degrees in order to play flute with less difficulty    Status  New    Target Date  12/25/17      PT LONG TERM GOAL #3   Title  report pain < 2/10 with activity for improved function    Status  New    Target Date  12/25/17      PT LONG TERM GOAL #4   Title  demonstrate 4/5 strength in Rt wrist for improved function    Status  New    Target Date  12/25/17      PT LONG TERM GOAL #5   Title  improve Rt grip strength to at least 35# for improved strength    Status  New    Target Date  12/25/17            Plan - 12/07/17 1002    Clinical Impression Statement  Pt continues to improve with ROM and strengthening and is progressing well with PT.      PT Treatment/Interventions  ADLs/Self Care Home Management;Cryotherapy;Electrical Stimulation;Moist Heat;Therapeutic exercise;Therapeutic activities;Ultrasound;Patient/family education;Manual techniques;Vasopneumatic Device;Taping;Passive range of motion;Dry  needling    PT Next Visit Plan  manual/joint mobs; review HEP and gentle strengthening exercises; DN PRN    Consulted and Agree with Plan of Care  Patient       Patient will benefit from skilled therapeutic intervention in order to improve the following deficits and impairments:  Pain, Impaired UE functional use, Decreased strength, Decreased range of motion, Postural dysfunction, Hypomobility, Increased fascial restricitons  Visit Diagnosis: Stiffness of right wrist, not elsewhere classified  Pain in right wrist  Muscle weakness (generalized)     Problem List Patient Active Problem List   Diagnosis Date Noted  . RLS (restless legs syndrome) 10/24/2017  . Pap smear for cervical cancer screening 09/15/2017  . Closed fracture of right distal radius 08/19/2017  . Physical exam 05/26/2017  . HTN (hypertension) 11/24/2016  . Hyperlipidemia 11/24/2016  . Memory loss 11/24/2016  . Major depression, recurrent ([place]) 11/24/2016  . Right knee pain 08/06/2013  . Hip pain, bilateral 11/16/2011  . Leg length inequality 11/16/2011      Laureen Abrahams, PT, DPT 12/07/17 10:06 AM    [place], [place], [address] Phone: [phone]   Fax:  [phone]  Name: Audrey Baxter MRN: [id number] Date of Birth: 06/21/1956

## 2017-12-20 ENCOUNTER — Ambulatory Visit: Payer: 59 | Admitting: Family Medicine

## 2017-12-20 ENCOUNTER — Other Ambulatory Visit: Payer: Self-pay | Admitting: Family Medicine

## 2017-12-20 MED FILL — PANTOPRAZOLE SOD DR 40 MG T: 40 | 30 days supply | Qty: 30 | Fill #0

## 2017-12-20 MED FILL — ZOLPIDEM TARTRATE 10 MG TAB: 10 | 90 days supply | Qty: 90 | Fill #0

## 2017-12-22 ENCOUNTER — Ambulatory Visit: Payer: 59 | Admitting: Family Medicine

## 2017-12-22 ENCOUNTER — Encounter: Payer: Self-pay | Admitting: Family Medicine

## 2017-12-22 ENCOUNTER — Other Ambulatory Visit: Payer: Self-pay

## 2017-12-22 VITALS — BP 126/84 | HR 83 | Temp 97.8°F | Resp 16 | Ht 63.0 in | Wt 153.5 lb

## 2017-12-22 DIAGNOSIS — G2581 Restless legs syndrome: Secondary | ICD-10-CM | POA: Diagnosis not present

## 2017-12-22 NOTE — Patient Instructions (Signed)
Follow up as needed or as scheduled No med changes at this time Take the Requip only if symptoms return Call with any questions or concerns Happy New Year!!!

## 2017-12-22 NOTE — Progress Notes (Signed)
   Subjective:    Patient ID: Audrey Baxter, female    DOB: 09-13-1956, 62 y.o.   MRN: [id number]  HPI RLS- pt was started on Requip on 11/6 and had complete resolution of RLS sxs.  She stopped the medication and these sxs have not returned.  Able to sleep at night w/o difficulty.  Able to sit and relax in the evenings w/o difficulty.   Review of Systems For ROS see HPI     Objective:   Physical Exam  Constitutional: She is oriented to person, place, and time. She appears well-developed and well-nourished. No distress.  HENT:  Head: Normocephalic and atraumatic.  Neurological: She is alert and oriented to person, place, and time. No cranial nerve deficit. Coordination normal.  Psychiatric: She has a normal mood and affect. Her behavior is normal. Thought content normal.  Vitals reviewed.         Assessment & Plan:

## 2017-12-22 NOTE — Assessment & Plan Note (Signed)
Resolved after short course of Requip.  No need to continue medications.  Will restart meds prn.  Pt expressed understanding and is in agreement w/ plan.

## 2017-12-26 ENCOUNTER — Ambulatory Visit (INDEPENDENT_AMBULATORY_CARE_PROVIDER_SITE_OTHER): Payer: 59 | Admitting: Physical Therapy

## 2017-12-26 ENCOUNTER — Encounter: Payer: Self-pay | Admitting: Physical Therapy

## 2017-12-26 DIAGNOSIS — M25631 Stiffness of right wrist, not elsewhere classified: Secondary | ICD-10-CM | POA: Diagnosis not present

## 2017-12-26 DIAGNOSIS — M25531 Pain in right wrist: Secondary | ICD-10-CM

## 2017-12-26 NOTE — Therapy (Signed)
[facility] [address] [place], [place], [address] Phone: [phone]   Fax:  [phone]  Physical Therapy Treatment/ReCert  Patient Details  Name: Audrey Baxter MRN: [id number] Date of Birth: 10-28-1956 Referring Provider: Dr. Dimple Nanas / Surgeon: Amedeo Plenty   Encounter Date: 12/26/2017  PT End of Session - 12/26/17 1009    Visit Number  7    Number of Visits  15    Date for PT Re-Evaluation  01/15/18    Authorization Type  [facility] [employer]    PT Start Time  0930    PT Stop Time  1008    PT Time Calculation (min)  38 min    Activity Tolerance  Patient tolerated treatment well    Behavior During Therapy  [facility] for tasks assessed/performed       Past Medical History:  Diagnosis Date  . Anxiety   . Arthritis   . Cancer ([place])    cervical/melanoma  . Depression   . GERD (gastroesophageal reflux disease)   . Heart murmur   . History of colon polyps   . Hyperlipidemia   . Hypertension   . Osteoporosis   . Radial fracture    Right  . Tremor     Past Surgical History:  Procedure Laterality Date  . BREAST SURGERY     Left lumpectomy  . BUNIONECTOMY Bilateral   . CARDIAC CATHETERIZATION     15 years ago  . OPEN REDUCTION INTERNAL FIXATION (ORIF) DISTAL RADIAL FRACTURE Right 08/19/2017   Procedure: OPEN REDUCTION INTERNAL FIXATION (ORIF) DISTAL RADIAL FRACTURE WITH REPAIR RECONSTRUCTION, ALLOGRAFT BONE GRAFT AS NECESSARY;  Surgeon: Roseanne Kaufman, MD;  Location: [employer];  Service: Orthopedics;  Laterality: Right;  90 MINS  . WRIST ARTHROPLASTY  2007    There were no vitals filed for this visit.  Subjective Assessment - 12/26/17 0933    Subjective  Pt states mild soreness with certain activities or increased use of hand. She has been doing HEP.     Patient Stated Goals  improve ROM    Currently in Pain?  Yes    Pain Score  1     Pain Location  Wrist    Pain Orientation  Right    Pain Descriptors / Indicators  Aching    Pain  Onset  More than a month ago    Pain Frequency  Intermittent         [facility] PT Assessment - 12/26/17 0001      AROM   Right Wrist Extension  68 Degrees    Right Wrist Flexion  60 Degrees    Right Wrist Radial Deviation  25 Degrees    Right Wrist Ulnar Deviation  36 Degrees      Strength   Overall Strength Comments  4 to 4-/5                   [facility] Adult PT Treatment/Exercise - 12/26/17 0933      Elbow Exercises   Forearm Supination  20 reps    Forearm Supination Limitations  2lb    Forearm Pronation  20 reps    Forearm Pronation Limitations  2lb    Wrist Flexion  Right;20 reps;Bar weights/barbell;Standing    Bar Weights/Barbell (Wrist Flexion)  2 lbs    Wrist Extension  Right;20 reps;Bar weights/barbell;Standing    Bar Weights/Barbell (Wrist Extension)  2 lbs    Wrist Extension Limitations  prayer stretch 30 sec x 3  Hand Exercises   Other Hand Exercises  --      Wrist Exercises   Other wrist exercises  --    Other wrist exercises  Standing shoulder flexion; 2lb x15; Rows RTB x20      Manual Therapy   Manual Therapy  Joint mobilization;Passive ROM    Joint Mobilization  --    Passive ROM  Rt wrist all motions including pronation/supination             PT Education - 12/26/17 1009    Education provided  Yes    Education Details  Activity and exercise progression, continuation of HEP , POC and d/c plan    Methods  Explanation    Comprehension  Verbalized understanding          PT Long Term Goals - 12/26/17 1010      PT LONG TERM GOAL #1   Title  independent with HEP    Status  Achieved      PT LONG TERM GOAL #2   Title  improve Rt wrist active flexion to at least 35 degrees in order to play flute with less difficulty    Status  Achieved    Target Date  01/16/18      PT LONG TERM GOAL #3   Title  report pain < 2/10 with activity for improved function    Status  On-going    Target Date  01/16/18      PT LONG TERM GOAL #4    Title  demonstrate 4/5 strength in Rt wrist for improved function    Status  On-going    Target Date  01/16/18      PT LONG TERM GOAL #5   Title  improve Rt grip strength to at least 35# for improved strength    Status  On-going    Target Date  01/16/18            Plan - 12/26/17 1011    Clinical Impression Statement  Pt showing good improvements in ROM. She has near normal ROM, but does have mild stiffness and mild sorneness at end range. She has improved strength , but still lacking full strength and endurance for functional activities. She has increased fatigue with longer duration activities. She will benefit from continuation of skilled care, at decreased frequency, 1x/wk for strength and ROM. Plan to work towards d/c  to HEP in next 2 weeks.     Clinical Presentation  Stable    Clinical Decision Making  Low    PT Frequency  1x / week    PT Duration  3 weeks    PT Treatment/Interventions  ADLs/Self Care Home Management;Cryotherapy;Electrical Stimulation;Moist Heat;Therapeutic exercise;Therapeutic activities;Ultrasound;Patient/family education;Manual techniques;Vasopneumatic Device;Taping;Passive range of motion;Dry needling    PT Next Visit Plan  manual/joint mobs; review HEP and gentle strengthening exercises; DN PRN    Consulted and Agree with Plan of Care  Patient       Patient will benefit from skilled therapeutic intervention in order to improve the following deficits and impairments:  Pain, Impaired UE functional use, Decreased strength, Decreased range of motion, Postural dysfunction, Hypomobility, Increased fascial restricitons  Visit Diagnosis: Stiffness of right wrist, not elsewhere classified - Plan: PT PLAN OF CARE CERT/RE-CERT  Pain in right wrist - Plan: PT PLAN OF CARE CERT/RE-CERT     Problem List Patient Active Problem List   Diagnosis Date Noted  . RLS (restless legs syndrome) 10/24/2017  . Pap smear for cervical cancer screening  09/15/2017  . Closed  fracture of right distal radius 08/19/2017  . Physical exam 05/26/2017  . HTN (hypertension) 11/24/2016  . Hyperlipidemia 11/24/2016  . Memory loss 11/24/2016  . Major depression, recurrent ([place]) 11/24/2016  . Right knee pain 08/06/2013  . Hip pain, bilateral 11/16/2011  . Leg length inequality 11/16/2011   Lyndee Hensen, PT, DPT 10:31 AM  12/26/17    [facility], [place], [address] Phone: [phone]   Fax:  [phone]  Name: Audrey Baxter MRN: [id number] Date of Birth: Oct 29, 1956

## 2018-01-03 ENCOUNTER — Encounter: Payer: Self-pay | Admitting: Physical Therapy

## 2018-01-03 ENCOUNTER — Ambulatory Visit (INDEPENDENT_AMBULATORY_CARE_PROVIDER_SITE_OTHER): Payer: 59 | Admitting: Physical Therapy

## 2018-01-03 DIAGNOSIS — M25531 Pain in right wrist: Secondary | ICD-10-CM

## 2018-01-03 DIAGNOSIS — M25631 Stiffness of right wrist, not elsewhere classified: Secondary | ICD-10-CM

## 2018-01-03 NOTE — Therapy (Signed)
[facility] [address] [place], [place], [address] Phone: [phone]   Fax:  [phone]  Physical Therapy Treatment  Patient Details  Name: Audrey Baxter MRN: [id number] Date of Birth: 31-Dec-1955 Referring Provider: Dr. Dimple Nanas / Surgeon: Amedeo Plenty   Encounter Date: 01/03/2018  PT End of Session - 01/03/18 1053    Visit Number  8    Number of Visits  15    Date for PT Re-Evaluation  01/15/18    Authorization Type  [facility] [employer]    PT Start Time  [phone]    PT Stop Time  0930    PT Time Calculation (min)  38 min    Activity Tolerance  Patient tolerated treatment well    Behavior During Therapy  [facility] for tasks assessed/performed       Past Medical History:  Diagnosis Date  . Anxiety   . Arthritis   . Cancer ([place])    cervical/melanoma  . Depression   . GERD (gastroesophageal reflux disease)   . Heart murmur   . History of colon polyps   . Hyperlipidemia   . Hypertension   . Osteoporosis   . Radial fracture    Right  . Tremor     Past Surgical History:  Procedure Laterality Date  . BREAST SURGERY     Left lumpectomy  . BUNIONECTOMY Bilateral   . CARDIAC CATHETERIZATION     15 years ago  . OPEN REDUCTION INTERNAL FIXATION (ORIF) DISTAL RADIAL FRACTURE Right 08/19/2017   Procedure: OPEN REDUCTION INTERNAL FIXATION (ORIF) DISTAL RADIAL FRACTURE WITH REPAIR RECONSTRUCTION, ALLOGRAFT BONE GRAFT AS NECESSARY;  Surgeon: Roseanne Kaufman, MD;  Location: [facility];  Service: Orthopedics;  Laterality: Right;  90 MINS  . WRIST ARTHROPLASTY  2007    There were no vitals filed for this visit.  Subjective Assessment - 01/03/18 1050    Subjective  Pt states improvements in function, she did one yoga session and was happy with progress. She did have mild muslce soreness after this. Pt also reports doing "traction" to wrist during yoga, which she was advised against, given dx.     Currently in Pain?  Yes    Pain Score  1     Pain  Location  Wrist    Pain Orientation  Right    Pain Descriptors / Indicators  Aching    Pain Type  Acute pain    Pain Onset  More than a month ago    Pain Frequency  Rarely                      [facility] Adult PT Treatment/Exercise - 01/03/18 0856      Elbow Exercises   Forearm Supination  20 reps    Forearm Supination Limitations  2lb    Forearm Pronation  20 reps    Forearm Pronation Limitations  2lb      Hand Exercises   Other Hand Exercises  Grip/squeeze with foam ball x25      Wrist Exercises   Wrist Flexion  Right;20 reps;Bar weights/barbell;Standing    Bar Weights/Barbell (Wrist Flexion)  2 lbs    Wrist Extension  Right;20 reps;Bar weights/barbell;Standing    Bar Weights/Barbell (Wrist Extension)  2 lbs    Wrist Radial Deviation  20 reps;Bar weights/barbell    Bar Weights/Barbell (Radial Deviation)  2 lbs    Other wrist exercises  Standing shoulder flexion; 2lb x15; Rows RTB x20; Bicep curl GTB x20; [employer]  press 2lb , 2x10;  wall push ups 2x10      Manual Therapy   Manual Therapy  Joint mobilization;Passive ROM    Joint Mobilization  Grade3, to increase flex and ext    Passive ROM  Rt wrist all motions including pronation/supination             PT Education - 01/03/18 1053    Education provided  Yes    Education Details  Activity progression, and what to avoid during yoga.     Person(s) Educated  Patient    Methods  Explanation    Comprehension  Verbalized understanding          PT Long Term Goals - 12/26/17 1010      PT LONG TERM GOAL #1   Title  independent with HEP    Status  Achieved      PT LONG TERM GOAL #2   Title  improve Rt wrist active flexion to at least 35 degrees in order to play flute with less difficulty    Status  Achieved    Target Date  01/16/18      PT LONG TERM GOAL #3   Title  report pain < 2/10 with activity for improved function    Status  On-going    Target Date  01/16/18      PT LONG TERM GOAL #4   Title   demonstrate 4/5 strength in Rt wrist for improved function    Status  On-going    Target Date  01/16/18      PT LONG TERM GOAL #5   Title  improve Rt grip strength to at least 35# for improved strength    Status  On-going    Target Date  01/16/18            Plan - 01/03/18 1054    Clinical Impression Statement  Pt with improving ROM and strength. Visible muscle fatigue (shaking) with resisted exercises and gripping today. Pt able to progress exercises without increased pain. Plan to review final HEP and d/c next visit.     PT Frequency  1x / week    PT Duration  3 weeks    PT Treatment/Interventions  ADLs/Self Care Home Management;Cryotherapy;Electrical Stimulation;Moist Heat;Therapeutic exercise;Therapeutic activities;Ultrasound;Patient/family education;Manual techniques;Vasopneumatic Device;Taping;Passive range of motion;Dry needling    PT Next Visit Plan  manual/joint mobs; review HEP and gentle strengthening exercises; DN PRN    Consulted and Agree with Plan of Care  Patient       Patient will benefit from skilled therapeutic intervention in order to improve the following deficits and impairments:  Pain, Impaired UE functional use, Decreased strength, Decreased range of motion, Postural dysfunction, Hypomobility, Increased fascial restricitons  Visit Diagnosis: Stiffness of right wrist, not elsewhere classified  Pain in right wrist     Problem List Patient Active Problem List   Diagnosis Date Noted  . RLS (restless legs syndrome) 10/24/2017  . Pap smear for cervical cancer screening 09/15/2017  . Closed fracture of right distal radius 08/19/2017  . Physical exam 05/26/2017  . HTN (hypertension) 11/24/2016  . Hyperlipidemia 11/24/2016  . Memory loss 11/24/2016  . Major depression, recurrent ([place]) 11/24/2016  . Right knee pain 08/06/2013  . Hip pain, bilateral 11/16/2011  . Leg length inequality 11/16/2011   Lyndee Hensen, PT, DPT 10:59 AM  01/03/18   [facility]  [place], [place], [address] Phone: [phone]   Fax:  [phone]  Name:  Audrey Baxter MRN: [id number] Date of Birth: 1956-03-14

## 2018-01-10 ENCOUNTER — Encounter: Payer: Self-pay | Admitting: Physical Therapy

## 2018-01-10 ENCOUNTER — Ambulatory Visit (INDEPENDENT_AMBULATORY_CARE_PROVIDER_SITE_OTHER): Payer: 59 | Admitting: Physical Therapy

## 2018-01-10 DIAGNOSIS — M25631 Stiffness of right wrist, not elsewhere classified: Secondary | ICD-10-CM | POA: Diagnosis not present

## 2018-01-10 DIAGNOSIS — M25531 Pain in right wrist: Secondary | ICD-10-CM | POA: Diagnosis not present

## 2018-01-10 NOTE — Therapy (Signed)
[facility] [address] [place], [place], [address] Phone: [phone]   Fax:  [phone]  Physical Therapy Treatment and D/C  Patient Details  Name: Audrey Baxter MRN: [id number] Date of Birth: 1956/09/27 Referring Provider: Dr. Dimple Nanas / Surgeon: Amedeo Plenty   Encounter Date: 01/10/2018  PT End of Session - 01/10/18 1109    Visit Number  9    Number of Visits  15    Date for PT Re-Evaluation  01/15/18    Authorization Type  [facility] [employer]    PT Start Time  0853    PT Stop Time  0928    PT Time Calculation (min)  35 min    Activity Tolerance  Patient tolerated treatment well    Behavior During Therapy  [facility] for tasks assessed/performed       Past Medical History:  Diagnosis Date  . Anxiety   . Arthritis   . Cancer ([place])    cervical/melanoma  . Depression   . GERD (gastroesophageal reflux disease)   . Heart murmur   . History of colon polyps   . Hyperlipidemia   . Hypertension   . Osteoporosis   . Radial fracture    Right  . Tremor     Past Surgical History:  Procedure Laterality Date  . BREAST SURGERY     Left lumpectomy  . BUNIONECTOMY Bilateral   . CARDIAC CATHETERIZATION     15 years ago  . OPEN REDUCTION INTERNAL FIXATION (ORIF) DISTAL RADIAL FRACTURE Right 08/19/2017   Procedure: OPEN REDUCTION INTERNAL FIXATION (ORIF) DISTAL RADIAL FRACTURE WITH REPAIR RECONSTRUCTION, ALLOGRAFT BONE GRAFT AS NECESSARY;  Surgeon: Roseanne Kaufman, MD;  Location: [facility];  Service: Orthopedics;  Laterality: Right;  90 MINS  . WRIST ARTHROPLASTY  2007    There were no vitals filed for this visit.  Subjective Assessment - 01/10/18 1107    Subjective  Pt states doing well, but having general soreness. She attributes some to the cold weather, and increasing activity level.     Currently in Pain?  Yes    Pain Score  3     Pain Location  Wrist    Pain Orientation  Right    Pain Descriptors / Indicators  Aching    Pain Type  Acute  pain    Pain Onset  More than a month ago    Pain Frequency  Intermittent         [facility] PT Assessment - 01/10/18 0001      AROM   Overall AROM Comments  wrist/elbow:  WNL      Strength   Overall Strength Comments  4+/5     Right Hand Grip (lbs)  27    Left Hand Grip (lbs)  45                  [facility] Adult PT Treatment/Exercise - 01/10/18 0856      Elbow Exercises   Forearm Supination  20 reps    Forearm Supination Limitations  2lb    Forearm Pronation  20 reps    Forearm Pronation Limitations  2lb      Hand Exercises   Other Hand Exercises  Grip/squeeze with foam ball x25      Wrist Exercises   Wrist Flexion  Right;20 reps;Bar weights/barbell;Standing    Bar Weights/Barbell (Wrist Flexion)  2 lbs    Wrist Extension  Right;20 reps;Bar weights/barbell;Standing    Bar Weights/Barbell (Wrist Extension)  2 lbs  Wrist Radial Deviation  20 reps;Bar weights/barbell    Bar Weights/Barbell (Radial Deviation)  2 lbs    Other wrist exercises  prayer stretch; wrist flex and ext stretches, 30 sec x3 each    Other wrist exercises  Standing shoulder flexion; 2lb x15;   Bicep curl 4 lb 2x10; ; Military press 3lb , 2x10;  wall push ups 2x10      Manual Therapy   Manual Therapy  Passive ROM    Joint Mobilization  --    Passive ROM  Rt wrist all motions including pronation/supination             PT Education - 01/10/18 1108    Education provided  Yes    Education Details  Activity progression, modification, rest, heat/ice, d/c plan    Person(s) Educated  Patient    Methods  Explanation    Comprehension  Verbalized understanding          PT Long Term Goals - 01/10/18 1110      PT LONG TERM GOAL #1   Title  independent with HEP    Status  Achieved      PT LONG TERM GOAL #2   Title  improve Rt wrist active flexion to at least 35 degrees in order to play flute with less difficulty    Status  Achieved      PT LONG TERM GOAL #3   Title  report pain < 2/10  with activity for improved function    Status  Partially Met      PT LONG TERM GOAL #4   Title  demonstrate 4/5 strength in Rt wrist for improved function    Status  Achieved      PT LONG TERM GOAL #5   Title  improve Rt grip strength to at least 35# for improved strength    Status  Partially Met         [facility] PT Assessment - 01/10/18 0001      AROM   Overall AROM Comments  wrist/elbow:  WNL      Strength   Overall Strength Comments  4+/5     Right Hand Grip (lbs)  27    Left Hand Grip (lbs)  45           Plan - 01/10/18 1110    Clinical Impression Statement  Pt has made good progress, and is ready for d/c to HEP. She has ROM and Strength WNL. She does have weakness with grip strength, but will continue to address with HEP. She is doing excellent with increasing activity level safely, but has mild soreness from recent increase in activity. Discussed less wrist activity, and use of heat/ice as well for soreness, but believe this will certainly improve as she gets stronger. Pt has met all goals for PT, pt in agreement with d/c.    PT Frequency  1x / week    PT Duration  3 weeks    PT Treatment/Interventions  ADLs/Self Care Home Management;Cryotherapy;Electrical Stimulation;Moist Heat;Therapeutic exercise;Therapeutic activities;Ultrasound;Patient/family education;Manual techniques;Vasopneumatic Device;Taping;Passive range of motion;Dry needling    PT Next Visit Plan  manual/joint mobs; review HEP and gentle strengthening exercises; DN PRN    Consulted and Agree with Plan of Care  Patient       Patient will benefit from skilled therapeutic intervention in order to improve the following deficits and impairments:  Pain, Impaired UE functional use, Decreased strength, Decreased range of motion, Postural dysfunction, Hypomobility, Increased fascial restricitons  Visit  Diagnosis: Stiffness of right wrist, not elsewhere classified  Pain in right wrist     Problem List Patient  Active Problem List   Diagnosis Date Noted  . RLS (restless legs syndrome) 10/24/2017  . Pap smear for cervical cancer screening 09/15/2017  . Closed fracture of right distal radius 08/19/2017  . Physical exam 05/26/2017  . HTN (hypertension) 11/24/2016  . Hyperlipidemia 11/24/2016  . Memory loss 11/24/2016  . Major depression, recurrent ([place]) 11/24/2016  . Right knee pain 08/06/2013  . Hip pain, bilateral 11/16/2011  . Leg length inequality 11/16/2011   Lyndee Hensen, PT, DPT 11:17 AM  01/10/18    [facility] [place], [place], [address] Phone: [phone]   Fax:  [phone]  Name: Audrey Baxter MRN: [id number] Date of Birth: [birth year]   PHYSICAL THERAPY DISCHARGE SUMMARY  Visits from Start of Care: 9   Plan: Patient agrees to discharge.  Patient goals were met. Patient is being discharged due to meeting the stated rehab goals.  ?????       Lyndee Hensen, PT, DPT 11:17 AM  01/10/18

## 2018-01-18 MED FILL — DESVENLAFAXINE SUC ER 100 M: 100 | 90 days supply | Qty: 90 | Fill #0

## 2018-01-18 MED FILL — BUPROPION HCL XL 300 MG TAB: 300 | 90 days supply | Qty: 90 | Fill #0

## 2018-01-19 ENCOUNTER — Ambulatory Visit
Admission: RE | Admit: 2018-01-19 | Discharge: 2018-01-19 | Disposition: A | Discharge status: Home / Self Care | Payer: 59 | Source: Ambulatory Visit | Attending: Family Medicine | Admitting: Family Medicine

## 2018-01-19 DIAGNOSIS — Z1231 Encounter for screening mammogram for malignant neoplasm of breast: Secondary | ICD-10-CM

## 2018-01-22 ENCOUNTER — Encounter: Payer: Self-pay | Admitting: General Practice

## 2018-01-30 MED FILL — ROSUVASTATIN CALCIUM 10 MG: 10 | 90 days supply | Qty: 90 | Fill #1

## 2018-01-30 MED FILL — PANTOPRAZOLE SOD DR 40 MG T: 40 | 30 days supply | Qty: 30 | Fill #1

## 2018-01-31 MED FILL — clonazePAM 0.5 MG TABS: 0.5 | 30 days supply | Qty: 90 | Fill #0

## 2018-03-01 DIAGNOSIS — L812 Freckles: Secondary | ICD-10-CM | POA: Diagnosis not present

## 2018-03-01 DIAGNOSIS — D225 Melanocytic nevi of trunk: Secondary | ICD-10-CM | POA: Diagnosis not present

## 2018-03-01 DIAGNOSIS — L218 Other seborrheic dermatitis: Secondary | ICD-10-CM | POA: Diagnosis not present

## 2018-03-01 DIAGNOSIS — L821 Other seborrheic keratosis: Secondary | ICD-10-CM | POA: Diagnosis not present

## 2018-03-01 DIAGNOSIS — L723 Sebaceous cyst: Secondary | ICD-10-CM | POA: Diagnosis not present

## 2018-03-01 MED FILL — HYDROCORTISONE 2.5% CREAM: 2.5 | 15 days supply | Qty: 30 | Fill #0

## 2018-03-05 MED FILL — PRIMIDONE 50 MG TABLET: 50 | 90 days supply | Qty: 360 | Fill #1

## 2018-03-05 MED FILL — PANTOPRAZOLE SOD DR 40 MG T: 40 | 30 days supply | Qty: 30 | Fill #2

## 2018-03-20 MED FILL — PROPRANOLOL 20 MG TABLET: 20 | 30 days supply | Qty: 30 | Fill #0
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
## 2018-03-28 ENCOUNTER — Ambulatory Visit: Payer: 59 | Admitting: Neurology

## 2018-03-28 ENCOUNTER — Encounter: Payer: Self-pay | Admitting: Neurology

## 2018-03-28 VITALS — BP 132/68 | HR 66 | Ht 63.5 in | Wt 150.0 lb

## 2018-03-28 DIAGNOSIS — G2581 Restless legs syndrome: Secondary | ICD-10-CM | POA: Diagnosis not present

## 2018-03-28 DIAGNOSIS — I7300 Raynaud's syndrome without gangrene: Secondary | ICD-10-CM

## 2018-03-28 DIAGNOSIS — E538 Deficiency of other specified B group vitamins: Secondary | ICD-10-CM

## 2018-03-28 DIAGNOSIS — G250 Essential tremor: Secondary | ICD-10-CM

## 2018-03-28 MED ORDER — PRIMIDONE 50 MG PO TABS: 100.0000 mg | ORAL_TABLET | Freq: Two times a day (BID) | ORAL | 1 refills | Status: DC

## 2018-03-28 NOTE — Patient Instructions (Signed)
1.  Start B12 - 1024mcg daily  2.  Continue primidone 50mg  - 2 tablets twice per day  3.  Follow up here as needed

## 2018-04-10 ENCOUNTER — Telehealth: Payer: Self-pay | Admitting: Podiatry

## 2018-04-10 ENCOUNTER — Encounter: Payer: Self-pay | Admitting: *Deleted

## 2018-04-10 NOTE — Telephone Encounter (Signed)
Left message informing pt I would have the letter stating she has metal fixation in both feet from surgery.

## 2018-04-10 NOTE — Telephone Encounter (Signed)
Patient is going on a ministry trip and needs documentation stating she has metal in both feet in order to fly. Dr Paulla Dolly is the one who performed the procedure on 02/16/2016 on the right foot. Then the left foot has been 10 years ago but Dr. Paulla Dolly did both procedures. If you can call her back at [phone]

## 2018-04-13 MED FILL — BuPROPion HCL ER (XL) 300 M: 300 | 90 days supply | Qty: 90 | Fill #1

## 2018-04-13 MED FILL — PANTOPRAZOLE SOD DR 40 MG T: 40 | 30 days supply | Qty: 30 | Fill #3

## 2018-04-13 MED FILL — DESVENLAFAXINE SUC ER 100 M: 100 | 90 days supply | Qty: 90 | Fill #1

## 2018-05-16 ENCOUNTER — Other Ambulatory Visit: Payer: Self-pay | Admitting: Family Medicine

## 2018-05-16 MED FILL — ROSUVASTATIN CALCIUM 10 MG: 10 | 90 days supply | Qty: 90 | Fill #0

## 2018-05-21 ENCOUNTER — Other Ambulatory Visit: Payer: Self-pay | Admitting: Family Medicine

## 2018-05-21 MED FILL — ARIPiprazole 10 MG TABS: 10 | 90 days supply | Qty: 90 | Fill #0

## 2018-05-21 MED FILL — PANTOPRAZOLE SOD DR 40 MG T: 40 | 30 days supply | Qty: 30 | Fill #0

## 2018-05-22 DIAGNOSIS — M542 Cervicalgia: Secondary | ICD-10-CM | POA: Diagnosis not present

## 2018-05-22 MED FILL — METHYLPREDNISOLONE 4 MG TAB: 4 | 6 days supply | Qty: 21 | Fill #0

## 2018-06-08 ENCOUNTER — Encounter: Payer: Self-pay | Admitting: Family Medicine

## 2018-06-08 ENCOUNTER — Ambulatory Visit: Payer: 59 | Admitting: Family Medicine

## 2018-06-08 ENCOUNTER — Other Ambulatory Visit: Payer: Self-pay

## 2018-06-08 VITALS — BP 156/96 | HR 89 | Temp 98.6°F | Resp 16 | Ht 63.5 in | Wt 153.8 lb

## 2018-06-08 DIAGNOSIS — K120 Recurrent oral aphthae: Secondary | ICD-10-CM

## 2018-06-08 NOTE — Progress Notes (Signed)
Subjective  CC:  Chief Complaint  Patient presents with  . Facial Laceration    spot on tongue, noticed a week ago, painful    HPI: Audrey Baxter is a 62 y.o. female who presents to the office today to address the problems listed above in the chief complaint.  Pleasant 62yo female with about 9 days of sore on right lateral tongue. Worries it could be cancer. Reports small sore spot that isn't going away. NO associated sxs: no ST, trauma to tongue, fever. Denies URI sxs, h/o cold sores, sick contacts or new medications. She is a non smoker. Using otc nsaid as needed for pain. Normal appetite.    Assessment  1. Aphthous ulcer of tongue      Plan   Viral ulcer:  Most c/w ulcer due to virus. Recommend continuing supportive care and give it more time to heal. IF not healing, would recommend bx: oral surgery or dentist to evaluate.   Follow up: Return if symptoms worsen or fail to improve.   No orders of the defined types were placed in this encounter.  No orders of the defined types were placed in this encounter.     I reviewed the patients updated PMH, FH, and SocHx.    Patient Active Problem List   Diagnosis Date Noted  . RLS (restless legs syndrome) 10/24/2017  . Pap smear for cervical cancer screening 09/15/2017  . Closed fracture of right distal radius 08/19/2017  . Physical exam 05/26/2017  . HTN (hypertension) 11/24/2016  . Hyperlipidemia 11/24/2016  . Memory loss 11/24/2016  . Major depression, recurrent ([place]) 11/24/2016  . Right knee pain 08/06/2013  . Hip pain, bilateral 11/16/2011  . Leg length inequality 11/16/2011   Current Meds  Medication Sig  . ARIPiprazole (ABILIFY) 10 MG tablet Take 5 mg by mouth every evening.   Marland Kitchen buPROPion (WELLBUTRIN XL) 300 MG 24 hr tablet Take 300 mg by mouth daily.  Marland Kitchen CALCIUM PO Take by mouth daily.  . cholecalciferol (VITAMIN D) 1000 units tablet Take 1,000 Units by mouth daily.  . clonazePAM (KLONOPIN) 0.5 MG tablet Take  0.5 mg by mouth as needed for anxiety. Rarely  . desvenlafaxine (PRISTIQ) 100 MG 24 hr tablet Take 100 mg by mouth daily.  . hydrocortisone cream 0.5 % hydrocortisone 2.5 % topical cream  . ibuprofen (ADVIL,MOTRIN) 200 MG tablet Take 400 mg by mouth 3 (three) times daily as needed for mild pain.  . Omega-3 Fatty Acids (FISH OIL) 1000 MG CAPS Take 1,000 mg by mouth daily.  . pantoprazole (PROTONIX) 40 MG tablet TAKE 1 TABLET BY MOUTH ONCE DAILY  . primidone (MYSOLINE) 50 MG tablet Take 2 tablets (100 mg total) by mouth 2 (two) times daily.  . rosuvastatin (CRESTOR) 10 MG tablet TAKE 1 TABLET BY MOUTH ONCE DAILY  . zolpidem (AMBIEN) 10 MG tablet zolpidem 10 mg tablet  prn    Allergies: Patient is allergic to pyridium  [phenazopyridine hcl]; atorvastatin; erythromycin base; pyridium [phenazopyridine hcl]; simvastatin; cephalexin; and vancomycin. Family History: Patient family history includes Alcohol abuse in her father and paternal aunt; COPD in her mother; Cancer in her brother, father, maternal aunt, and paternal grandfather; HIV/AIDS in her brother; Heart attack in her maternal grandfather; Stroke in her maternal grandmother. Social History:  Patient  reports that she quit smoking about 39 years ago. She has never used smokeless tobacco. She reports that she drinks alcohol. She reports that she does not use drugs.  Review of Systems:  Constitutional: Negative for fever malaise or anorexia Cardiovascular: negative for chest pain Respiratory: negative for SOB or persistent cough Gastrointestinal: negative for abdominal pain  Objective  Vitals: BP (!) 156/96   Pulse 89   Temp 98.6 F (37 C) (Oral)   Resp 16   Ht 5' 3.5" (1.613 m)   Wt 153 lb 12.8 oz (69.8 kg)   SpO2 97%   BMI 26.82 kg/m  General: no acute distress , A&Ox3 HEENT: PEERL, conjunctiva normal, Oropharynx moist,neck is supple, right mid lateral tongue with approx 27mm ulcer with surrounding erythema. No leukoplakia. Mildly  tender. No cervical LAD.    Commons side effects, risks, benefits, and alternatives for medications and treatment plan prescribed today were discussed, and the patient expressed understanding of the given instructions. Patient is instructed to call or message via MyChart if he/she has any questions or concerns regarding our treatment plan. No barriers to understanding were identified. We discussed Red Flag symptoms and signs in detail. Patient expressed understanding regarding what to do in case of urgent or emergency type symptoms.   Medication list was reconciled, printed and provided to the patient in AVS. Patient instructions and summary information was reviewed with the patient as documented in the AVS. This note was prepared with assistance of Dragon voice recognition software. Occasional wrong-word or sound-a-like substitutions may have occurred due to the inherent limitations of voice recognition software

## 2018-06-08 NOTE — Patient Instructions (Signed)
Please follow up if symptoms do not improve or as needed.  Consider checking with your dentist if the sore remains.   Take advil and tylenol as needed. Can also use Anbesol to the sore for pain relief.   Oral Ulcers Oral ulcers are sores inside the mouth or near the mouth. They may be called canker sores or cold sores, which are two types of oral ulcers. Many oral ulcers are harmless and go away on their own. In some cases, oral ulcers may require medical care to determine the cause and proper treatment. What are the causes? Common causes of this condition include:  Viral, bacterial, or fungal infection.  Emotional stress.  Foods or chemicals that irritate the mouth.  Injury or physical irritation of the mouth.  Medicines.  Allergies.  Tobacco use.  Less common causes include:  Skin disease.  A type of herpes virus infection (herpes simplexor herpes zoster).  Oral cancer.  In some cases, the cause of this condition may not be known. What increases the risk? Oral ulcers are more likely to develop in:  People who wear dental braces, dentures, or retainers.  People who do not keep their mouth clean or brush their teeth regularly.  People who have sensitive skin.  People who have conditions that affect the entire body (systemic conditions), such as immune disorders.  What are the signs or symptoms? The main symptom of this condition is one or more oval-shaped or round ulcers that have red borders. Details about symptoms may vary depending on the cause.  Location of the ulcers. They may be inside the mouth, on the gums, or on the insides of the lips or cheeks. They may also be on the lips or on skin that is near the mouth, such as the cheeks and chin.  Pain. Ulcers can be painful and uncomfortable, or they can be painless.  Appearance of the ulcers. They may look like red blisters and be filled with fluid, or they may be white or yellow patches.  Frequency of  outbreaks. Ulcers may go away permanently after one outbreak, or they may come back (recur) often or rarely.  How is this diagnosed? This condition is diagnosed with a physical exam. Your health care provider may ask you questions about your lifestyle and your medical history. You may have tests, including:  Blood tests.  Removal of a small number of cells from an ulcer to be examined under a microscope (biopsy).  How is this treated? This condition is treated by managing any pain and discomfort, and by treating the underlying cause of the ulcers, if necessary. Usually, oral ulcers resolve by themselves in 1-2 weeks. You may be told to keep your mouth clean and avoid things that cause or irritate your ulcers. Your health care provider may prescribe medicines to reduce pain and discomfort or treat the underlying cause, if this applies. Follow these instructions at home: Lifestyle  Follow instructions from your health care provider about eating or drinking restrictions. ? Drink enough fluid to keep your urine clear or pale yellow. ? Avoid foods and drinks that irritate your ulcers.  Avoid tobacco products, including cigarettes, chewing tobacco, or e-cigarettes. If you need help quitting, ask your health care provider.  Avoid excessive alcohol use. Oral Hygiene  Avoid physical or chemical irritants that may have caused the ulcers or made them worse, such as mouthwashes that contain alcohol (ethanol). If you wear dental braces, dentures, or retainers, work with your health care provider to  make sure these devices are fitted correctly.  Brush and floss your teeth at least once every day, and get regular dental cleanings and checkups.  Gargle with a salt-water mixture 3-4 times per day or as told by your health care provider. To make a salt-water mixture, completely dissolve -1 tsp of salt in 1 cup of warm water. General instructions  Take over-the-counter and prescription medicines only as  told by your health care provider.  If you have pain, wrap a cold compress in a towel and gently press it against your face to help reduce pain.  Keep all follow-up visits as told by your health care provider. This is important. Contact a health care provider if:  You have pain that gets worse or does not get better with medicine.  You have 4 or more ulcers at one time.  You have a fever.  You have new ulcers that look or feel different from other ulcers you have.  You have inflammation in one eye or both eyes.  You have ulcers that do not go away after 10 days.  You develop new symptoms in your mouth, such as: ? Bleeding or crusting around your lips or gums. ? Tooth pain. ? Difficulty swallowing.  You develop symptoms on your skin or genitals, such as: ? A rash or blisters. ? Burning or itching sensations.  Your ulcers begin or get worse after you start a new medicine. Get help right away if:  You have difficulty breathing.  You have swelling in your face or neck.  You have excessive bleeding from your mouth.  You have severe pain. This information is not intended to replace advice given to you by your health care provider. Make sure you discuss any questions you have with your health care provider. Document Released: 01/12/2005 Document Revised: 05/09/2016 Document Reviewed: 04/22/2015 Elsevier Interactive Patient Education  [employer].

## 2018-06-13 ENCOUNTER — Ambulatory Visit: Payer: 59 | Admitting: Family Medicine

## 2018-06-13 ENCOUNTER — Encounter: Payer: Self-pay | Admitting: Family Medicine

## 2018-06-13 ENCOUNTER — Other Ambulatory Visit: Payer: Self-pay

## 2018-06-13 VITALS — BP 121/80 | HR 78 | Temp 98.1°F | Resp 16 | Ht 64.0 in | Wt 152.2 lb

## 2018-06-13 DIAGNOSIS — R300 Dysuria: Secondary | ICD-10-CM

## 2018-06-13 DIAGNOSIS — R319 Hematuria, unspecified: Secondary | ICD-10-CM

## 2018-06-13 DIAGNOSIS — N3000 Acute cystitis without hematuria: Secondary | ICD-10-CM

## 2018-06-13 DIAGNOSIS — R82998 Other abnormal findings in urine: Secondary | ICD-10-CM

## 2018-06-13 LAB — POCT URINALYSIS DIPSTICK
Bilirubin, UA: NEGATIVE
GLUCOSE UA: NEGATIVE
Ketones, UA: NEGATIVE
LEUKOCYTES UA: NEGATIVE
Nitrite, UA: NEGATIVE
Protein, UA: NEGATIVE
Spec Grav, UA: 1.01 (ref 1.010–1.025)
Urobilinogen, UA: 0.2 E.U./dL
pH, UA: 6 (ref 5.0–8.0)

## 2018-06-13 MED ORDER — CIPROFLOXACIN HCL 500 MG PO TABS: 500.0000 mg | ORAL_TABLET | Freq: Two times a day (BID) | ORAL | 0 refills | Status: DC

## 2018-06-13 MED FILL — CIPROFLOXACIN HCL 500 MG TA: 500 | 3 days supply | Qty: 6 | Fill #0

## 2018-06-13 NOTE — Patient Instructions (Signed)
Follow up as needed or as scheduled We'll notify you of your urine culture and make any changes if needed Drink LOTS of fluids Start the Cipro twice daily- take w/ food Avoid direct or prolonged sun exposure while on the medication Call with any questions or concerns Have a great summer!!

## 2018-06-13 NOTE — Progress Notes (Signed)
   Subjective:    Patient ID: Audrey Baxter, female    DOB: January 27, 1956, 62 y.o.   MRN: [id number]  HPI UTI- woke this AM w/ burning and increased frequency.  Denies incomplete emptying.  No fevers.  Some LBP but she feels this is unrelated.  No suprapubic pain.  No blood in urine.  + urgency.  Had to stop multiple times on way to work.   Review of Systems For ROS see HPI     Objective:   Physical Exam  Constitutional: She appears well-developed and well-nourished. No distress.  Abdominal: Soft. She exhibits no distension. There is no tenderness (no suprapubic or CVA tenderness).  Skin: Skin is warm and dry.  Psychiatric: She has a normal mood and affect. Her behavior is normal. Thought content normal.  Vitals reviewed.         Assessment & Plan:  UTI- pt's sxs and PE consistent w/ infxn.  Start abx.  Reviewed supportive care and red flags that should prompt return.  Pt expressed understanding and is in agreement w/ plan.

## 2018-06-15 ENCOUNTER — Encounter: Payer: Self-pay | Admitting: Family Medicine

## 2018-06-15 LAB — URINE CULTURE
MICRO NUMBER:: 90764306
SPECIMEN QUALITY:: ADEQUATE

## 2018-06-15 MED ORDER — FLUCONAZOLE 150 MG PO TABS
150.0000 mg | ORAL_TABLET | Freq: Once | ORAL | 0 refills | Status: AC
Start: 2018-06-15 — End: 2018-06-15

## 2018-06-15 MED FILL — FLUCONAZOLE 150 MG TABS: 150 | 1 days supply | Qty: 1 | Fill #0

## 2018-06-15 MED FILL — PANTOPRAZOLE SOD DR 40 MG T: 40 | 30 days supply | Qty: 30 | Fill #1

## 2018-06-15 MED FILL — PRIMIDONE 50 MG TABLET: 50 | 90 days supply | Qty: 360 | Fill #0

## 2018-07-17 MED FILL — buPROPion HCL ER (XL) 300 M: 300 | 90 days supply | Qty: 90 | Fill #0

## 2018-07-17 MED FILL — DESVENLAFAXINE SUC ER 100 M: 100 | 90 days supply | Qty: 90 | Fill #0

## 2018-07-20 DIAGNOSIS — L821 Other seborrheic keratosis: Secondary | ICD-10-CM | POA: Diagnosis not present

## 2018-07-20 DIAGNOSIS — L812 Freckles: Secondary | ICD-10-CM | POA: Diagnosis not present

## 2018-07-20 DIAGNOSIS — L570 Actinic keratosis: Secondary | ICD-10-CM | POA: Diagnosis not present

## 2018-07-20 DIAGNOSIS — Z85820 Personal history of malignant melanoma of skin: Secondary | ICD-10-CM | POA: Diagnosis not present

## 2018-07-20 DIAGNOSIS — D485 Neoplasm of uncertain behavior of skin: Secondary | ICD-10-CM | POA: Diagnosis not present

## 2018-08-09 MED FILL — PANTOPRAZOLE SOD DR 40 MG T: 40 | 30 days supply | Qty: 30 | Fill #2

## 2018-08-10 MED FILL — clonazePAM 0.5 MG TABS: 0.5 | 10 days supply | Qty: 30 | Fill #0

## 2018-08-16 MED FILL — tiZANidine HCL 4 MG TABS: 4 | 90 days supply | Qty: 270 | Fill #0

## 2018-08-17 MED FILL — ZOLPIDEM TARTRATE 10 MG TAB: 10 | 90 days supply | Qty: 90 | Fill #0

## 2018-08-17 MED FILL — ARIPiprazole 10 MG TABS: 10 | 90 days supply | Qty: 90 | Fill #0

## 2018-08-23 ENCOUNTER — Other Ambulatory Visit: Payer: Self-pay

## 2018-08-23 ENCOUNTER — Emergency Department (HOSPITAL_COMMUNITY): Payer: 59

## 2018-08-23 ENCOUNTER — Emergency Department (HOSPITAL_COMMUNITY)
Admission: EM | Admit: 2018-08-23 | Discharge: 2018-08-23 | Disposition: A | Discharge status: Home / Self Care | Payer: 59 | Attending: Emergency Medicine | Admitting: Emergency Medicine

## 2018-08-23 ENCOUNTER — Encounter (HOSPITAL_COMMUNITY): Payer: Self-pay

## 2018-08-23 ENCOUNTER — Ambulatory Visit: Payer: Self-pay | Admitting: *Deleted

## 2018-08-23 DIAGNOSIS — Z79899 Other long term (current) drug therapy: Secondary | ICD-10-CM | POA: Diagnosis not present

## 2018-08-23 DIAGNOSIS — E876 Hypokalemia: Secondary | ICD-10-CM | POA: Insufficient documentation

## 2018-08-23 DIAGNOSIS — R0989 Other specified symptoms and signs involving the circulatory and respiratory systems: Secondary | ICD-10-CM | POA: Diagnosis not present

## 2018-08-23 DIAGNOSIS — I10 Essential (primary) hypertension: Secondary | ICD-10-CM | POA: Diagnosis not present

## 2018-08-23 DIAGNOSIS — E871 Hypo-osmolality and hyponatremia: Secondary | ICD-10-CM | POA: Insufficient documentation

## 2018-08-23 DIAGNOSIS — Z87891 Personal history of nicotine dependence: Secondary | ICD-10-CM | POA: Diagnosis not present

## 2018-08-23 HISTORY — DX: Insomnia, unspecified: G47.00

## 2018-08-23 LAB — BASIC METABOLIC PANEL
Anion gap: 12 (ref 5–15)
BUN: 12 mg/dL (ref 8–23)
CO2: 24 mmol/L (ref 22–32)
Calcium: 9.3 mg/dL (ref 8.9–10.3)
Chloride: 91 mmol/L — ABNORMAL LOW (ref 98–111)
Creatinine, Ser: 0.61 mg/dL (ref 0.44–1.00)
GFR calc Af Amer: >60 mL/min (ref >60)
GFR calc non Af Amer: >60 mL/min (ref >60)
GLUCOSE: 118 mg/dL — AB (ref 70–99)
POTASSIUM: 3.4 mmol/L — AB (ref 3.5–5.1)
Sodium: 127 mmol/L — ABNORMAL LOW (ref 135–145)

## 2018-08-23 LAB — CBC WITH DIFFERENTIAL/PLATELET
Basophils Absolute: 0 10*3/uL (ref 0.0–0.1)
Basophils Relative: 1 %
Eosinophils Absolute: 0.1 10*3/uL (ref 0.0–0.7)
Eosinophils Relative: 1 %
HCT: 35.1 % — ABNORMAL LOW (ref 36.0–46.0)
Hemoglobin: 12.7 g/dL (ref 12.0–15.0)
LYMPHS ABS: 2 10*3/uL (ref 0.7–4.0)
LYMPHS PCT: 31 %
MCH: 31.1 pg (ref 26.0–34.0)
MCHC: 36.2 g/dL — AB (ref 30.0–36.0)
MCV: 86 fL (ref 78.0–100.0)
Monocytes Absolute: 0.4 10*3/uL (ref 0.1–1.0)
Monocytes Relative: 6 %
NEUTROS PCT: 61 %
Neutro Abs: 3.9 10*3/uL (ref 1.7–7.7)
Platelets: 284 10*3/uL (ref 150–400)
RBC: 4.08 MIL/uL (ref 3.87–5.11)
RDW: 11.8 % (ref 11.5–15.5)
WBC: 6.4 10*3/uL (ref 4.0–10.5)

## 2018-08-23 LAB — I-STAT TROPONIN, ED: Troponin i, poc: 0.03 ng/mL (ref 0.00–0.08)

## 2018-08-23 LAB — BRAIN NATRIURETIC PEPTIDE: B Natriuretic Peptide: 15.5 pg/mL (ref 0.0–100.0)

## 2018-08-23 MED ORDER — POTASSIUM CHLORIDE CRYS ER 20 MEQ PO TBCR
40.0000 meq | EXTENDED_RELEASE_TABLET | Freq: Once | ORAL | Status: AC
  Administered 2018-08-23: 40 meq via ORAL
  Filled 2018-08-23: qty 2

## 2018-08-23 MED ORDER — LISINOPRIL 5 MG PO TABS: 5.0000 mg | ORAL_TABLET | Freq: Every day | ORAL | 0 refills | Status: DC

## 2018-08-23 MED ORDER — LISINOPRIL 10 MG PO TABS
10.0000 mg | ORAL_TABLET | Freq: Once | ORAL | Status: AC
  Administered 2018-08-23: 10 mg via ORAL
  Filled 2018-08-23: qty 1

## 2018-08-23 MED ORDER — SODIUM CHLORIDE 0.9 % IV BOLUS
1000.0000 mL | Freq: Once | INTRAVENOUS | Status: AC
  Administered 2018-08-23: 1000 mL via INTRAVENOUS

## 2018-08-23 NOTE — Discharge Instructions (Addendum)
You will need to follow-up with your primary doctor in regards to your elevated blood pressure today.  You should also follow-up to have your labs rechecked as your sodium, chloride and potassium were low.  You should follow up with your regular doctor or cardiologist with regard to your EKG changes.  Please monitor your blood pressures at home and keep track of them so that you can bring them to your primary care appointment.  Please return to the emergency department for any headaches, lightheadedness, dizziness, vision changes numbness or weakness to the arms or legs, chest pain, shortness of breath or any new or worsening symptoms in the meantime.

## 2018-08-23 NOTE — ED Triage Notes (Addendum)
patient states she took her BP at home and it was 222/118. Patient called her doctor instructed to the patient to come to the ED. Patient was taken off of Lisinopril in April 2019. Patient states she has felt flushed and jittery all day.

## 2018-08-23 NOTE — ED Provider Notes (Signed)
[facility] DEPT Provider Note   CSN: [id number] Arrival date & time: 08/23/18  1732     History   Chief Complaint Chief Complaint  Patient presents with  . Hypertension    HPI Audrey Baxter is a 62 y.o. female.  HPI   Patient is a 63 year old female with a history of cancer, depression, anxiety, GERD, heart murmur, hyperlipidemia, hypertension, osteoporosis who presents emergency department for evaluation of hypertension.  Patient states that today she has felt intermittently flushed and feels like her essential tremor is somewhat worse today.  States that because of her symptoms she checked her blood pressure and realized that it was 213 systolic.  States that she had not been sitting for 5 minutes prior to taking her blood pressure.  This was a wrist cuff that she used.  States her legs run across when she was taking her blood pressure and she was sitting.  She contacted her PCP who advised her to come to the ED for evaluation.  Patient denies any headaches, lightheadedness, dizziness, vision changes, numbness or weakness to the arms or legs.  Denies any chest pain, shortness of breath, diaphoresis, palpitations or decreased urine output.  Denies any recent fevers chills or other symptoms.  States that she used to be on lisinopril last year and was weaned off of it per her preference.  She stopped taking lisinopril in April 2019.  States she has been trying to control her blood pressure with diet and exercise.  States she has a history of hypertension and stressful situations.  Denies any particularly stressful situations that occurred recently.  Past Medical History:  Diagnosis Date  . Anxiety   . Arthritis   . Cancer ([place])    cervical/melanoma  . Depression   . GERD (gastroesophageal reflux disease)   . Heart murmur   . History of colon polyps   . Hyperlipidemia   . Hypertension   . Insomnia   . Osteoporosis   . Radial fracture    Right    . Tremor     Patient Active Problem List   Diagnosis Date Noted  . RLS (restless legs syndrome) 10/24/2017  . Pap smear for cervical cancer screening 09/15/2017  . Closed fracture of right distal radius 08/19/2017  . Physical exam 05/26/2017  . HTN (hypertension) 11/24/2016  . Hyperlipidemia 11/24/2016  . Major depression, recurrent ([place]) 11/24/2016  . Right knee pain 08/06/2013  . Hip pain, bilateral 11/16/2011  . Leg length inequality 11/16/2011    Past Surgical History:  Procedure Laterality Date  . BREAST CYST EXCISION Left   . BREAST SURGERY     Left lumpectomy  . BUNIONECTOMY Bilateral   . CARDIAC CATHETERIZATION     15 years ago  . OPEN REDUCTION INTERNAL FIXATION (ORIF) DISTAL RADIAL FRACTURE Right 08/19/2017   Procedure: OPEN REDUCTION INTERNAL FIXATION (ORIF) DISTAL RADIAL FRACTURE WITH REPAIR RECONSTRUCTION, ALLOGRAFT BONE GRAFT AS NECESSARY;  Surgeon: Roseanne Kaufman, MD;  Location: [facility];  Service: Orthopedics;  Laterality: Right;  90 MINS  . WRIST ARTHROPLASTY  2007     OB History   None      Home Medications    Prior to Admission medications   Medication Sig Start Date End Date Taking? Authorizing Provider  ARIPiprazole (ABILIFY) 10 MG tablet Take 5 mg by mouth every evening.  04/13/17  Yes [provider]  buPROPion (WELLBUTRIN XL) 300 MG 24 hr tablet Take 300 mg by mouth daily.   Yes  [provider]  CALCIUM PO Take 1 tablet by mouth 2 (two) times daily.    Yes [provider]  cholecalciferol (VITAMIN D) 1000 units tablet Take 1,000 Units by mouth daily.   Yes [provider]  desvenlafaxine (PRISTIQ) 100 MG 24 hr tablet Take 100 mg by mouth daily.   Yes [provider]  Omega-3 Fatty Acids (FISH OIL) 1000 MG CAPS Take 1,000 mg by mouth daily.   Yes [provider]  pantoprazole (PROTONIX) 40 MG tablet TAKE 1 TABLET BY MOUTH ONCE DAILY 05/21/18  Yes Midge Minium, MD  primidone (MYSOLINE) 50 MG  tablet Take 2 tablets (100 mg total) by mouth 2 (two) times daily. 03/28/18  Yes Tat, Eustace Quail, DO  rosuvastatin (CRESTOR) 10 MG tablet TAKE 1 TABLET BY MOUTH ONCE DAILY 05/16/18  Yes Midge Minium, MD  tiZANidine (ZANAFLEX) 4 MG tablet Take 4 mg by mouth at bedtime as needed for muscle spasms.  08/16/18  Yes [provider]  zolpidem (AMBIEN) 10 MG tablet Take 10 mg by mouth at bedtime as needed for sleep.    Yes [provider]  ciprofloxacin (CIPRO) 500 MG tablet Take 1 tablet (500 mg total) by mouth 2 (two) times daily. Patient not taking: Reported on 08/23/2018 06/13/18   Midge Minium, MD  hydrocortisone cream 0.5 % Apply 1 application topically daily as needed for itching.     [provider]  ibuprofen (ADVIL,MOTRIN) 200 MG tablet Take 400 mg by mouth 3 (three) times daily as needed for mild pain.    [provider]  lisinopril (PRINIVIL,ZESTRIL) 5 MG tablet Take 1 tablet (5 mg total) by mouth daily. 08/23/18   Anique Beckley S, PA-C    Family History Family History  Problem Relation Age of Onset  . COPD Mother   . Cancer Father        lung, bladder  . Alcohol abuse Father   . HIV/AIDS Brother   . Cancer Maternal Aunt        breast  . Alcohol abuse Paternal Aunt   . Stroke Maternal Grandmother   . Heart attack Maternal Grandfather   . Cancer Paternal Grandfather        lung/spine  . Cancer Brother        lung    Social History Social History   Tobacco Use  . Smoking status: Former Smoker    Last attempt to quit: 12/19/1978    Years since quitting: 39.7  . Smokeless tobacco: Never Used  Substance Use Topics  . Alcohol use: Yes    Alcohol/week: 0.0 standard drinks    Comment: glass wine every other night  . Drug use: No     Allergies   Pyridium  [phenazopyridine hcl]; Atorvastatin; Erythromycin base; Pyridium [phenazopyridine hcl]; Simvastatin; Cephalexin; and Vancomycin   Review of Systems Review of Systems    Constitutional: Negative for chills and fever.       Flushing  HENT: Negative for sore throat.   Eyes: Negative for pain and visual disturbance.  Respiratory: Negative for cough and shortness of breath.   Cardiovascular: Negative for chest pain, palpitations and leg swelling.  Gastrointestinal: Negative for abdominal pain, nausea and vomiting.  Genitourinary: Negative for decreased urine volume.  Musculoskeletal: Negative for back pain.  Skin: Negative for rash.  Neurological: Negative for dizziness, facial asymmetry, speech difficulty, weakness, light-headedness, numbness and headaches.       Essential tremor is worse  All other systems  reviewed and are negative.  Physical Exam Updated Vital Signs BP 137/76   Pulse 72   Temp 98.4 F (36.9 C) (Oral)   Resp 19   Ht 5\' 3"  (1.6 m)   Wt 65.8 kg   SpO2 97%   BMI 25.69 kg/m   Physical Exam  Constitutional: She appears well-developed and well-nourished. No distress.  HENT:  Head: Normocephalic and atraumatic.  Eyes: Conjunctivae are normal.  Neck: Neck supple.  Cardiovascular: Normal rate, regular rhythm, normal heart sounds and intact distal pulses.  No murmur heard. Pulmonary/Chest: Effort normal and breath sounds normal. No respiratory distress. She has no wheezes.  Abdominal: Soft. Bowel sounds are normal. She exhibits no distension. There is no tenderness. There is no guarding.  Musculoskeletal: She exhibits no edema.  Neurological: She is alert.  Mental Status:  Alert, thought content appropriate, able to give a coherent history. Speech fluent without evidence of aphasia. Able to follow 2 step commands without difficulty.  Cranial Nerves:  II:  Peripheral visual fields grossly normal, pupils equal, round, reactive to light III,IV, VI: ptosis not present, extra-ocular motions intact bilaterally  V,VII: smile symmetric, facial light touch sensation equal VIII: hearing grossly normal to voice  X: uvula elevates  symmetrically  XI: bilateral shoulder shrug symmetric and strong XII: midline tongue extension without fassiculations Motor:  Normal tone. 5/5 strength of BUE and BLE major muscle groups including strong and equal grip strength and dorsiflexion/plantar flexion Sensory: light touch normal in all extremities. Cerebellar: no dysmetria with finger-to-nose with bilateral upper extremities, though essential tremor noted, normal heel to shin Gait: normal gait and balance.  Negative romberg, negative pronator drift  Skin: Skin is warm and dry. Capillary refill takes less than 2 seconds.  Psychiatric: She has a normal mood and affect.  Nursing note and vitals reviewed.  ED Treatments / Results  Labs (all labs ordered are listed, but only abnormal results are displayed) Labs Reviewed  CBC WITH DIFFERENTIAL/PLATELET - Abnormal; Notable for the following components:      Result Value   HCT 35.1 (*)    MCHC 36.2 (*)    All other components within normal limits  BASIC METABOLIC PANEL - Abnormal; Notable for the following components:   Sodium 127 (*)    Potassium 3.4 (*)    Chloride 91 (*)    Glucose, Bld 118 (*)    All other components within normal limits  BRAIN NATRIURETIC PEPTIDE  I-STAT TROPONIN, ED    EKG EKG Interpretation  Date/Time:  Thursday August 23 2018 19:02:50 EDT Ventricular Rate:  83 PR Interval:    QRS Duration: 175 QT Interval:  426 QTC Calculation: 501 R Axis:   32 Text Interpretation:  Sinus rhythm Left bundle branch block Otherwise no significant change Confirmed by Deno Etienne [phone]) on 08/23/2018 7:42:32 PM   Radiology Dg Chest 2 View  Result Date: 08/23/2018 CLINICAL DATA:  patient states she took her BP at home and it was 222/118. Patient called her doctor instructed to the patient to come to the ED. Patient was taken off of Lisinopril in April 2019. Patient states she has felt flushed and jittery all day. EXAM: CHEST - 2 VIEW COMPARISON:  None. FINDINGS:  The heart size and mediastinal contours are within normal limits. Both lungs are clear. The visualized skeletal structures are unremarkable. IMPRESSION: No active cardiopulmonary disease. Electronically Signed   By: Nolon Nations M.D.   On: 08/23/2018 20:26    Procedures Procedures (including critical care  time)  Medications Ordered in ED Medications  sodium chloride 0.9 % bolus 1,000 mL (1,000 mLs Intravenous New Bag/Given 08/23/18 2047)  lisinopril (PRINIVIL,ZESTRIL) tablet 10 mg (10 mg Oral Given 08/23/18 1920)  potassium chloride SA (K-DUR,KLOR-CON) CR tablet 40 mEq (40 mEq Oral Given 08/23/18 2014)     Initial Impression / Assessment and Plan / ED Course  I have reviewed the triage vital signs and the nursing notes.  Pertinent labs & imaging results that were available during my care of the patient were reviewed by me and considered in my medical decision making (see chart for details).    Discussed pt presentation and exam findings with Dr. Tyrone Nine, who agrees with the current workup. He personally reviewed the EKG and feels that patient is safe for discharge with close f/u with pcp.    Final Clinical Impressions(s) / ED Diagnoses   Final diagnoses:  Hypertension, unspecified type  Hypokalemia  Hyponatremia   Patient's blood pressure elevated in the emergency department today. Patient denies headache, change in vision, numbness, weakness, chest pain, dyspnea, dizziness, or lightheadedness therefore doubt hypertensive emergency. Lab work is reassuring and with normal Cr, normal troponin and normal BNP.  There is some electro-like changes including hyponatremia, hypochloremia and hypokalemia.  Give small bolus of fluids in the ED and advised patient to follow-up with her PCP to have these levels rechecked.  She is not exhibiting any emergent signs or symptoms from these abnormalities that would require her to have further work-up or admission to the hospital.  Her chest x-ray is  negative.  She does have a new left bundle block branch block on her EKG however she does not have any chest pain or shortness of breath and her troponin is negative.  Patient states she has tolerated lisinopril in the past to treat her blood pressures.  Will start her on 5 mg of lisinopril and have her follow-up closely with her PCP.  Advised to monitor blood pressures prior to be seen by PCP.  Advised to follow-up with regards to her electively abnormalities to have a recheck.  Discussed elevated blood pressure with the patient and the need for primary care follow up with potential need to initiate or change antihypertensive medications and or for further evaluation. Discussed return precaution signs/symptoms for hypertensive emergency as listed above with the patient. She confirmed understanding.    ED Discharge Orders         Ordered    lisinopril (PRINIVIL,ZESTRIL) 5 MG tablet  Daily     08/23/18 2138           Rodney Booze, PA-C 08/23/18 2142    Deno Etienne, DO 08/23/18 2228

## 2018-08-23 NOTE — Telephone Encounter (Signed)
Pt called with having an elevated B/P. She is using a home automatic cuff machine. Her b/p is 222/112 and rechecked 216/116. Unable to check heart rate. She has denied any cardiac symptoms. She had been on a b/p medication, lisinopril until April. And she stated that her b/p had been good since then.  She is going to the Ed at [place] to be assessed. Pt voiced understanding and her husband will be taking her.   Reason for Disposition . [1] Systolic BP  >= 747 OR Diastolic >= 159  AND [5] having NO cardiac or neurologic symptoms  Answer Assessment - Initial Assessment Questions 1. BLOOD PRESSURE: "What is the blood pressure?" "Did you take at least two measurements 5 minutes apart?"     222/112 and 216/116 2. ONSET: "When did you take your blood pressure?"     Right now 3. HOW: "How did you obtain the blood pressure?" (e.g., visiting nurse, automatic home BP monitor)     Automatic home BP monitor cuff 4. HISTORY: "Do you have a history of high blood pressure?"     yes 5. MEDICATIONS: "Are you taking any medications for blood pressure?" "Have you missed any doses recently?"     No not taking bp medications 6. OTHER SYMPTOMS: "Do you have any symptoms?" (e.g., headache, chest pain, blurred vision, difficulty breathing, weakness)     no 7. PREGNANCY: "Is there any chance you are pregnant?" "When was your last menstrual period?"     no  Protocols used: HIGH BLOOD PRESSURE-A-AH

## 2018-08-24 MED FILL — LISINOPRIL 5 MG TABLET: 5 | 30 days supply | Qty: 30 | Fill #0

## 2018-08-31 ENCOUNTER — Other Ambulatory Visit: Payer: Self-pay

## 2018-08-31 ENCOUNTER — Encounter: Payer: Self-pay | Admitting: Family Medicine

## 2018-08-31 ENCOUNTER — Ambulatory Visit: Payer: 59 | Admitting: Family Medicine

## 2018-08-31 VITALS — BP 150/88 | HR 66 | Temp 98.1°F | Resp 16 | Ht 64.0 in | Wt 147.4 lb

## 2018-08-31 DIAGNOSIS — E871 Hypo-osmolality and hyponatremia: Secondary | ICD-10-CM

## 2018-08-31 DIAGNOSIS — Z7184 Encounter for health counseling related to travel: Secondary | ICD-10-CM

## 2018-08-31 DIAGNOSIS — I10 Essential (primary) hypertension: Secondary | ICD-10-CM

## 2018-08-31 DIAGNOSIS — Z23 Encounter for immunization: Secondary | ICD-10-CM

## 2018-08-31 DIAGNOSIS — Z7189 Other specified counseling: Secondary | ICD-10-CM | POA: Diagnosis not present

## 2018-08-31 DIAGNOSIS — N941 Unspecified dyspareunia: Secondary | ICD-10-CM

## 2018-08-31 LAB — BASIC METABOLIC PANEL
BUN: 9 mg/dL (ref 6–23)
CHLORIDE: 97 meq/L (ref 96–112)
CO2: 23 mEq/L (ref 19–32)
Calcium: 9.3 mg/dL (ref 8.4–10.5)
Creatinine, Ser: 0.69 mg/dL (ref 0.40–1.20)
GFR: 91.12 mL/min (ref >60.00)
GLUCOSE: 96 mg/dL (ref 70–99)
POTASSIUM: 4.3 meq/L (ref 3.5–5.1)
Sodium: 129 mEq/L — ABNORMAL LOW (ref 135–145)

## 2018-08-31 MED ORDER — TYPHOID VACCINE PO CPDR: 1.0000 | DELAYED_RELEASE_CAPSULE | ORAL | 0 refills | Status: AC

## 2018-08-31 MED ORDER — PANTOPRAZOLE SODIUM 40 MG PO TBEC
40.0000 mg | DELAYED_RELEASE_TABLET | Freq: Every day | ORAL | 1 refills | Status: DC
Start: 2018-08-31 — End: 2018-12-20

## 2018-08-31 MED ORDER — TYPHOID VACCINE PO CPDR: 1.0000 | DELAYED_RELEASE_CAPSULE | ORAL | 0 refills | Status: DC

## 2018-08-31 MED ORDER — LISINOPRIL 10 MG PO TABS: 10.0000 mg | ORAL_TABLET | Freq: Every day | ORAL | 3 refills | Status: DC

## 2018-08-31 MED ORDER — PANTOPRAZOLE SODIUM 40 MG PO TBEC: 40.0000 mg | DELAYED_RELEASE_TABLET | Freq: Every day | ORAL | 1 refills | Status: DC

## 2018-08-31 MED FILL — LISINOPRIL 10 MG TABLET: 10 | 90 days supply | Qty: 90 | Fill #0

## 2018-08-31 MED FILL — ROSUVASTATIN CALCIUM 10 MG: 10 | 90 days supply | Qty: 90 | Fill #1

## 2018-08-31 NOTE — Assessment & Plan Note (Signed)
Deteriorated.  Pt's BP has been running high for the last week and possibly prior to that.  Her BP is improving since restarting the Lisinopril but is still not at goal.  Will increase to 10mg  daily and monitor for improvement.  Pt expressed understanding and is in agreement w/ plan.

## 2018-08-31 NOTE — Progress Notes (Signed)
   Subjective:    Patient ID: Audrey Baxter, female    DOB: 1956/10/02, 62 y.o.   MRN: [id number]  HPI HTN- chronic problem, pt was able to wean off her Lisinopril last year but ended up going to ER on 9/5 when she felt flushed and wrist cuff indicated BP was quite high (892 systolic).  BP was 168/82 upon arrival to ER but down to 137/76 at time of d/c.  Was also found to be hypokalemic and hyponatremic.  Was given a fluid bolus and told to f/u w/ PCP to recheck BP and labs.  Was restarted on Lisinopril 5mg  daily.  BP was normal in June.  Pt reports she has not been feeling well x2 weeks- 'jittery and not too great'.  Home BPs since restarting the Lisinopril have been ranging 130-150.  Pt reports feeling better after restarting the Lisinopril but today feels 'bad again'.  No CP, SOB.  Few 'mild HAs'.  No swelling of hands/feet.  Travel Advice- pt wants to know what vaccines are needed to go to [place]  Dyspareunia- pt is having painful intercourse, 'like an organ is being pounded or something'.  Review of Systems For ROS see HPI     Objective:   Physical Exam  Constitutional: She is oriented to person, place, and time. She appears well-developed and well-nourished. No distress.  HENT:  Head: Normocephalic and atraumatic.  Eyes: Pupils are equal, round, and reactive to light. Conjunctivae and EOM are normal.  Neck: Normal range of motion. Neck supple. No thyromegaly present.  Cardiovascular: Normal rate, regular rhythm, normal heart sounds and intact distal pulses.  No murmur heard. Pulmonary/Chest: Effort normal and breath sounds normal. No respiratory distress.  Abdominal: Soft. She exhibits no distension. There is no tenderness.  Musculoskeletal: She exhibits no edema.  Lymphadenopathy:    She has no cervical adenopathy.  Neurological: She is alert and oriented to person, place, and time.  Skin: Skin is warm and dry.  Psychiatric: She has a normal mood and affect. Her behavior is  normal.  Vitals reviewed.         Assessment & Plan:  Hyponatremia- new.  Check labs to determine if further work up needed.  Travel Advice- pt recommended to get Hep A vaccine and Typhoid prior to going to [place].  Pt wants to wait on Hep A, typhoid vaccine sent.  Dyspareunia- due to the sensation that an 'organ is being hit', will refer to GYN for complete evaluation and tx.  Pt expressed understanding and is in agreement w/ plan.

## 2018-08-31 NOTE — Patient Instructions (Signed)
Follow up in 3-4 weeks to recheck BP We'll notify you of your lab results and make any changes if needed INCREASE the Lisinopril to 10mg  daily- new prescription sent We'll call you with your GYN appt SCHEDULE a nurse visit for your Hep A and Shingles shots Start the Typhoid vaccine as directed- complete this in advance of your travel Call with any questions or concerns Hang in there!

## 2018-09-05 ENCOUNTER — Other Ambulatory Visit: Payer: Self-pay | Admitting: Family Medicine

## 2018-09-05 DIAGNOSIS — E785 Hyperlipidemia, unspecified: Secondary | ICD-10-CM

## 2018-09-05 DIAGNOSIS — I10 Essential (primary) hypertension: Secondary | ICD-10-CM

## 2018-09-15 MED FILL — PANTOPRAZOLE SOD DR 40 MG T: 40 | 30 days supply | Qty: 30 | Fill #3

## 2018-09-17 ENCOUNTER — Other Ambulatory Visit (INDEPENDENT_AMBULATORY_CARE_PROVIDER_SITE_OTHER): Payer: 59

## 2018-09-17 ENCOUNTER — Telehealth: Payer: Self-pay | Admitting: Family Medicine

## 2018-09-17 DIAGNOSIS — I10 Essential (primary) hypertension: Secondary | ICD-10-CM | POA: Diagnosis not present

## 2018-09-17 DIAGNOSIS — E785 Hyperlipidemia, unspecified: Secondary | ICD-10-CM

## 2018-09-17 LAB — LIPID PANEL
CHOLESTEROL: 191 mg/dL (ref 0–200)
HDL: 66.5 mg/dL (ref >39.00)
LDL Cholesterol: 96 mg/dL (ref 0–99)
NonHDL: 124.45
Total CHOL/HDL Ratio: 3
Triglycerides: 143 mg/dL (ref 0.0–149.0)
VLDL: 28.6 mg/dL (ref 0.0–40.0)

## 2018-09-17 LAB — BASIC METABOLIC PANEL
BUN: 14 mg/dL (ref 6–23)
CALCIUM: 9.7 mg/dL (ref 8.4–10.5)
CO2: 30 mEq/L (ref 19–32)
Chloride: 99 mEq/L (ref 96–112)
Creatinine, Ser: 0.74 mg/dL (ref 0.40–1.20)
GFR: 84.04 mL/min (ref >60.00)
GLUCOSE: 100 mg/dL — AB (ref 70–99)
POTASSIUM: 4.4 meq/L (ref 3.5–5.1)
SODIUM: 137 meq/L (ref 135–145)

## 2018-09-17 LAB — HEPATIC FUNCTION PANEL
ALT: 15 U/L (ref 0–35)
AST: 16 U/L (ref 0–37)
Albumin: 4.5 g/dL (ref 3.5–5.2)
Alkaline Phosphatase: 72 U/L (ref 39–117)
BILIRUBIN TOTAL: 0.4 mg/dL (ref 0.2–1.2)
Bilirubin, Direct: 0.1 mg/dL (ref 0.0–0.3)
Total Protein: 6.9 g/dL (ref 6.0–8.3)

## 2018-09-17 LAB — TSH: TSH: 2.14 u[IU]/mL (ref 0.35–4.50)

## 2018-09-17 NOTE — Telephone Encounter (Signed)
Pt would like to go to the breast center for a bone density scan. Pt can be reached at the home #

## 2018-09-17 NOTE — Telephone Encounter (Signed)
Called pt and informed that she is not due for a bone density exam until 2020 as her last one was in 2018. And insurance will only cover them every 2 years.

## 2018-09-18 ENCOUNTER — Encounter: Payer: Self-pay | Admitting: General Practice

## 2018-09-19 MED FILL — VIVOTIF EC CAPSULE: 8 days supply | Qty: 4 | Fill #0

## 2018-09-19 NOTE — Progress Notes (Signed)
Cardiology Office Note:    Date:  09/20/2018   ID:  Audrey Baxter, DOB [birth year], MRN [id number]  PCP:  Midge Minium, MD  Cardiologist:  No primary care provider on file.   Referring MD: Midge Minium, MD   Chief Complaint  Patient presents with  . Chest Pain  . Shortness of Breath  . Abnormal ECG    History of Present Illness:    Audrey Baxter is a 62 y.o. female with a hx of  Hypertension, hyperlipidemia, and depression.  Elevated blood pressure, headache, and generally not feeling well.  At the visit it was a long emergency room with extreme blood pressure elevation.  She did been on lisinopril for approximately 5 years but then the medication was stopped because her blood pressure was low.  Subsequently lisinopril has been resumed.  Blood pressures have been running around 150/80.  In the emergency room under 150 mmHg.  Her blood pressures at home prior to going to the emergency room peaked at 229 mmHg systolic.  Lisinopril was resumed.  She was also told of some abnormality on her EKG.  Subsequently she has had some vague tightness in the chest and dyspnea.  Can last up to 30 minutes.  She does a lot of walking and hiking and is not experiencing any of these symptoms with activity.  She snores and her husband also states that she at times gasps for air.  She does not feel well rested after awakening.  Past Medical History:  Diagnosis Date  . Anxiety   . Arthritis   . Cancer ([place])    cervical/melanoma  . Depression   . GERD (gastroesophageal reflux disease)   . Heart murmur   . History of colon polyps   . Hyperlipidemia   . Hypertension   . Insomnia   . Osteoporosis   . Radial fracture    Right  . Tremor     Past Surgical History:  Procedure Laterality Date  . BREAST CYST EXCISION Left   . BREAST SURGERY     Left lumpectomy  . BUNIONECTOMY Bilateral   . CARDIAC CATHETERIZATION     15 years ago  . OPEN REDUCTION INTERNAL FIXATION  (ORIF) DISTAL RADIAL FRACTURE Right 08/19/2017   Procedure: OPEN REDUCTION INTERNAL FIXATION (ORIF) DISTAL RADIAL FRACTURE WITH REPAIR RECONSTRUCTION, ALLOGRAFT BONE GRAFT AS NECESSARY;  Surgeon: Roseanne Kaufman, MD;  Location: [facility];  Service: Orthopedics;  Laterality: Right;  90 MINS  . WRIST ARTHROPLASTY  2007    Current Medications: Current Meds  Medication Sig  . ARIPiprazole (ABILIFY) 10 MG tablet Take 5 mg by mouth every evening.   Marland Kitchen buPROPion (WELLBUTRIN XL) 300 MG 24 hr tablet Take 300 mg by mouth daily.  Marland Kitchen CALCIUM-VITAMIN D PO Take 1 tablet by mouth 2 (two) times daily.  . clonazePAM (KLONOPIN) 0.5 MG tablet Take 0.5 mg by mouth daily as needed for anxiety.  . cyanocobalamin 1000 MCG tablet Take 1,000 mcg by mouth daily.  Marland Kitchen desvenlafaxine (PRISTIQ) 100 MG 24 hr tablet Take 100 mg by mouth daily.  . hydrocortisone cream 0.5 % Apply 1 application topically daily as needed for itching.   Marland Kitchen ibuprofen (ADVIL,MOTRIN) 200 MG tablet Take 400 mg by mouth 3 (three) times daily as needed for mild pain.  Marland Kitchen lisinopril (PRINIVIL,ZESTRIL) 10 MG tablet Take 1 tablet (10 mg total) by mouth daily.  Marland Kitchen loratadine (CLARITIN) 10 MG tablet Take 10 mg by mouth daily.  . Omega-3 Fatty Acids (  FISH OIL) 1000 MG CAPS Take 1,000 mg by mouth daily.  . pantoprazole (PROTONIX) 40 MG tablet Take 1 tablet (40 mg total) by mouth daily.  . primidone (MYSOLINE) 50 MG tablet Take 2 tablets (100 mg total) by mouth 2 (two) times daily.  . rosuvastatin (CRESTOR) 10 MG tablet TAKE 1 TABLET BY MOUTH ONCE DAILY  . ROYAL JELLY PO Take 1 capsule by mouth daily as needed (energy).  . typhoid (VIVOTIF) DR capsule Take 1 capsule by mouth every other day.  . zolpidem (AMBIEN) 10 MG tablet Take 10 mg by mouth at bedtime as needed for sleep.      Allergies:   Pyridium  [phenazopyridine hcl]; Atorvastatin; Erythromycin base; Pyridium [phenazopyridine hcl]; Simvastatin; Cephalexin; and Vancomycin   Social History   Socioeconomic  History  . Marital status: Married    Spouse name: Not on file  . Number of children: Not on file  . Years of education: Not on file  . Highest education level: Not on file  Occupational History  . Occupation: chaplain    Comment: [employer]  Social Needs  . Financial resource strain: Not on file  . Food insecurity:    Worry: Not on file    Inability: Not on file  . Transportation needs:    Medical: Not on file    Non-medical: Not on file  Tobacco Use  . Smoking status: Former Smoker    Last attempt to quit: 12/19/1978    Years since quitting: 39.7  . Smokeless tobacco: Never Used  Substance and Sexual Activity  . Alcohol use: Yes    Alcohol/week: 0.0 standard drinks    Comment: glass wine every other night  . Drug use: No  . Sexual activity: Yes  Lifestyle  . Physical activity:    Days per week: Not on file    Minutes per session: Not on file  . Stress: Not on file  Relationships  . Social connections:    Talks on phone: Not on file    Gets together: Not on file    Attends religious service: Not on file    Active member of club or organization: Not on file    Attends meetings of clubs or organizations: Not on file    Relationship status: Not on file  Other Topics Concern  . Not on file  Social History Narrative  . Not on file     Family History: The patient's family history includes Alcohol abuse in her father and paternal aunt; COPD in her mother; Cancer in her brother, father, maternal aunt, and paternal grandfather; HIV/AIDS in her brother; Heart attack in her maternal grandfather; Stroke in her maternal grandmother.  ROS:   Please see the history of present illness.    Developing hearing loss, depression, has been told she snores, headache, and chest discomfort as described above.  All other systems reviewed and are negative.    EKGs/Labs/Other Studies Reviewed:    The following studies were reviewed today: None  EKG:  EKG is  ordered today.  The ekg ordered  today demonstrates heart rate 77 bpm with interventricular conduction delay with QRS 100 ms.  When compared to the prior tracing of 08/24/2018, the QRS duration is shorter and left bundle branch block as disappeared.  The heart rate on the September EKG tracings with slower than today's tracing.  Recent Labs: 08/23/2018: B Natriuretic Peptide 15.5; Hemoglobin 12.7; Platelets 284 09/17/2018: ALT 15; BUN 14; Creatinine, Ser 0.74; Potassium 4.4; Sodium 137; TSH  2.14  Recent Lipid Panel    Component Value Date/Time   CHOL 191 09/17/2018 0803   TRIG 143.0 09/17/2018 0803   HDL 66.50 09/17/2018 0803   CHOLHDL 3 09/17/2018 0803   VLDL 28.6 09/17/2018 0803   LDLCALC 96 09/17/2018 0803    Physical Exam:    VS:  BP 130/80   Pulse 77   Ht 5\' 3"  (1.6 m)   Wt 147 lb 12.8 oz (67 kg)   BMI 26.18 kg/m     Wt Readings from Last 3 Encounters:  09/20/18 147 lb 12.8 oz (67 kg)  08/31/18 147 lb 6 oz (66.8 kg)  08/23/18 145 lb (65.8 kg)     GEN:  Well nourished, well developed in no acute distress HEENT: Normal NECK: No JVD. LYMPHATICS: No lymphadenopathy CARDIAC: RRR, no murmur, no gallop, no edema. VASCULAR: 2+ bilateral radial and posterior tibial pulses bilateral.  No abdominal pulsation.  No bruits. RESPIRATORY:  Clear to auscultation without rales, wheezing or rhonchi  ABDOMEN: Soft, non-tender, non-distended, No pulsatile mass, MUSCULOSKELETAL: No deformity  SKIN: Warm and dry NEUROLOGIC:  Alert and oriented x 3 PSYCHIATRIC:  Normal affect   ASSESSMENT:    1. Sleep apnea, unspecified type   2. Left bundle branch block   3. Essential hypertension   4. Chest pressure    PLAN:    In order of problems listed above:  1. Chest heaviness and shortness of breath intermittently.  Not specifically exertion related.  No associated diaphoresis.  Strong family history of CAD.  Her father although he lived to be in his late 54O was afflicted by severe coronary disease and had bypass surgery.   She had transient left bundle branch block in September when she went to the emergency room feeling sick.  Plan to perform a stress myocardial perfusion study and if she develops a rate related bundle we will have to switch over to [employer]. 2. Transient left bundle.  Nonspecific interventricular conduction delay present on the current tracing.  Does not appear to be rate related.  Last noted on EKG in September 2019. 3. Blood pressure at times has been quite high but usually on her own blood pressure monitor.  Plan to follow closely and monitor blood pressure on exercise treadmill test.  2D Doppler echocardiogram will be done to rule out left ventricular hypertrophy/wall motion abnormality/other given bazaar wide QRS prolongation during left bundle branch block. 4. Sleep apnea may be a risk factor for cardiovascular events.  She snores and feels tired all the time.  We will see what the work-up above identifies.  Likely will need a sleep study at some point to be certain that this is not an underlying continue with CV stressor.   Medication Adjustments/Labs and Tests Ordered: Current medicines are reviewed at length with the patient today.  Concerns regarding medicines are outlined above.  Orders Placed This Encounter  Procedures  . MYOCARDIAL PERFUSION IMAGING  . EKG 12-Lead  . ECHOCARDIOGRAM COMPLETE   No orders of the defined types were placed in this encounter.   Patient Instructions  Medication Instructions:  Your physician recommends that you continue on your current medications as directed. Please refer to the Current Medication list given to you today.  Labwork: None  Testing/Procedures: Your physician has requested that you have an echocardiogram. Echocardiography is a painless test that uses sound waves to create images of your heart. It provides your doctor with information about the size and shape of your heart  and how well your heart's chambers and valves are working.  This procedure takes approximately one hour. There are no restrictions for this procedure.  Your physician has requested that you have an exercise stress myoview. For further information please visit [email or web address]. Please follow instruction sheet, as given.   Follow-Up: Your physician recommends that you schedule a follow-up appointment as needed with Dr. Tamala Julian.    Any Other Special Instructions Will Be Listed Below (If Applicable).     If you need a refill on your cardiac medications before your next appointment, please call your pharmacy.      Signed, Sinclair Grooms, MD  09/20/2018 4:39 PM    [place]

## 2018-09-20 ENCOUNTER — Encounter: Payer: Self-pay | Admitting: Interventional Cardiology

## 2018-09-20 ENCOUNTER — Encounter: Payer: Self-pay | Admitting: *Deleted

## 2018-09-20 ENCOUNTER — Ambulatory Visit: Payer: 59 | Admitting: Interventional Cardiology

## 2018-09-20 VITALS — BP 130/80 | HR 77 | Ht 63.0 in | Wt 147.8 lb

## 2018-09-20 DIAGNOSIS — R0789 Other chest pain: Secondary | ICD-10-CM

## 2018-09-20 DIAGNOSIS — G4730 Sleep apnea, unspecified: Secondary | ICD-10-CM | POA: Diagnosis not present

## 2018-09-20 DIAGNOSIS — I447 Left bundle-branch block, unspecified: Secondary | ICD-10-CM | POA: Diagnosis not present

## 2018-09-20 DIAGNOSIS — I10 Essential (primary) hypertension: Secondary | ICD-10-CM

## 2018-09-20 NOTE — Patient Instructions (Signed)
Medication Instructions:  Your physician recommends that you continue on your current medications as directed. Please refer to the Current Medication list given to you today.  Labwork: None  Testing/Procedures: Your physician has requested that you have an echocardiogram. Echocardiography is a painless test that uses sound waves to create images of your heart. It provides your doctor with information about the size and shape of your heart and how well your heart's chambers and valves are working. This procedure takes approximately one hour. There are no restrictions for this procedure.  Your physician has requested that you have an exercise stress myoview. For further information please visit [email or web address]. Please follow instruction sheet, as given.   Follow-Up: Your physician recommends that you schedule a follow-up appointment as needed with Dr. Tamala Julian.    Any Other Special Instructions Will Be Listed Below (If Applicable).     If you need a refill on your cardiac medications before your next appointment, please call your pharmacy.

## 2018-09-25 ENCOUNTER — Ambulatory Visit: Payer: 59 | Admitting: Women's Health

## 2018-09-25 ENCOUNTER — Encounter: Payer: Self-pay | Admitting: Women's Health

## 2018-09-25 VITALS — BP 118/78 | Ht 63.0 in | Wt 149.0 lb

## 2018-09-25 DIAGNOSIS — N952 Postmenopausal atrophic vaginitis: Secondary | ICD-10-CM

## 2018-09-25 DIAGNOSIS — N941 Unspecified dyspareunia: Secondary | ICD-10-CM

## 2018-09-25 DIAGNOSIS — Z01419 Encounter for gynecological examination (general) (routine) without abnormal findings: Secondary | ICD-10-CM

## 2018-09-25 MED ORDER — ESTRADIOL 2 MG VA RING: 2.0000 mg | VAGINAL_RING | VAGINAL | 3 refills | Status: DC

## 2018-09-25 MED FILL — ESTRING 2 MG VAGINAL RING: 2 | 90 days supply | Qty: 1 | Fill #0

## 2018-09-25 NOTE — Progress Notes (Signed)
62 y.o. MWF G0P0 presents as a new patient problem visit.  Postmenopausal no HRT with no bleeding.  Reports vaginal dryness and dyspareunia, fells like an organ is being hit making intercourse near impossible.  Has annual exams with primary care, up to date on mammo, colonoscopy, and flu vaccination. Normal pap with primary care MD in  09-15-2017.   History of an abnormal Pap many years ago with cryo,  normal Paps after. Osteoporosis on no medication per primary care.  Numerous medical problems including hypertension, hypercholesteremia, cardiac problems, nonessential benign tremor, and depression.  Follow-up scheduled with cardiologist for stress test and echocardiogram.  Denies itching, burning with urination, or vaginal bleeding.  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  Husband retired. Has 1 cat. Patient has fractured bilateral wrists due to falls. Parents are deceased.  2-brothers, living with history of HIV, lung cancer    ROS:  General: A & O x4.  Appearance normal  Gentitourinary              Inguinal/mons:  Normal without inguinal adenopathy             External genitalia:  Normal             BUS/Urethra/Skene's glands:  Normal             Vagina:   Atrophic with dryness             Cervix:  Normal with atrophy              Uterus:   normal in size, shape and contour.  Midline and mobile             Adnexa/parametria:                           Rt:        Without masses or tenderness.                         Lt:        Without masses or tenderness  Assesment 62 year old MWF G0 with dyspareunia/vaginal drop atrophy Hypertension/hypercholesteremia/depression/osteoporosis/cardiac problem primary care and cardiologist manage labs and meds  Plan: Options for vaginal dryness reviewed.  Estring 2 mg per vagina for 3 months E scribed.  Reviewed minimal systemic absorption instructed to call if no relief continue over-the-counter vaginal lubricants.  Strongly encouraged to  weightbearing/balance type exercise to incorporate yoga, or pilate's.  Will decrease food calories intake and carbs for weight management.  Shingles vaccination reviewed and encouraged.  Continue SBE's, annual screening mammogram.  Increase calcium rich foods and Vitamin D.  Home safety, fall prevention discussed.  Encouraged to wear shoes in the home, have a night light, and remove lose rugs.

## 2018-09-25 NOTE — Patient Instructions (Signed)
shingrex  Shingles vaccine  Health Maintenance for Postmenopausal Women Menopause is a normal process in which your reproductive ability comes to an end. This process happens gradually over a span of months to years, usually between the ages of 8 and 69. Menopause is complete when you have missed 12 consecutive menstrual periods. It is important to talk with your health care provider about some of the most common conditions that affect postmenopausal women, such as heart disease, cancer, and bone loss (osteoporosis). Adopting a healthy lifestyle and getting preventive care can help to promote your health and wellness. Those actions can also lower your chances of developing some of these common conditions. What should I know about menopause? During menopause, you may experience a number of symptoms, such as:  Moderate-to-severe hot flashes.  Night sweats.  Decrease in sex drive.  Mood swings.  Headaches.  Tiredness.  Irritability.  Memory problems.  Insomnia.  Choosing to treat or not to treat menopausal changes is an individual decision that you make with your health care provider. What should I know about hormone replacement therapy and supplements? Hormone therapy products are effective for treating symptoms that are associated with menopause, such as hot flashes and night sweats. Hormone replacement carries certain risks, especially as you become older. If you are thinking about using estrogen or estrogen with progestin treatments, discuss the benefits and risks with your health care provider. What should I know about heart disease and stroke? Heart disease, heart attack, and stroke become more likely as you age. This may be due, in part, to the hormonal changes that your body experiences during menopause. These can affect how your body processes dietary fats, triglycerides, and cholesterol. Heart attack and stroke are both medical emergencies. There are many things that you can do  to help prevent heart disease and stroke:  Have your blood pressure checked at least every 1-2 years. High blood pressure causes heart disease and increases the risk of stroke.  If you are 55-49 years old, ask your health care provider if you should take aspirin to prevent a heart attack or a stroke.  Do not use any tobacco products, including cigarettes, chewing tobacco, or electronic cigarettes. If you need help quitting, ask your health care provider.  It is important to eat a healthy diet and maintain a healthy weight. ? Be sure to include plenty of vegetables, fruits, low-fat dairy products, and lean protein. ? Avoid eating foods that are high in solid fats, added sugars, or salt (sodium).  Get regular exercise. This is one of the most important things that you can do for your health. ? Try to exercise for at least 150 minutes each week. The type of exercise that you do should increase your heart rate and make you sweat. This is known as moderate-intensity exercise. ? Try to do strengthening exercises at least twice each week. Do these in addition to the moderate-intensity exercise.  Know your numbers.Ask your health care provider to check your cholesterol and your blood glucose. Continue to have your blood tested as directed by your health care provider.  What should I know about cancer screening? There are several types of cancer. Take the following steps to reduce your risk and to catch any cancer development as early as possible. Breast Cancer  Practice breast self-awareness. ? This means understanding how your breasts normally appear and feel. ? It also means doing regular breast self-exams. Let your health care provider know about any changes, no matter how small.  If you are 40 or older, have a clinician do a breast exam (clinical breast exam or CBE) every year. Depending on your age, family history, and medical history, it may be recommended that you also have a yearly breast  X-ray (mammogram).  If you have a family history of breast cancer, talk with your health care provider about genetic screening.  If you are at high risk for breast cancer, talk with your health care provider about having an MRI and a mammogram every year.  Breast cancer (BRCA) gene test is recommended for women who have family members with BRCA-related cancers. Results of the assessment will determine the need for genetic counseling and BRCA1 and for BRCA2 testing. BRCA-related cancers include these types: ? Breast. This occurs in males or females. ? Ovarian. ? Tubal. This may also be called fallopian tube cancer. ? Cancer of the abdominal or pelvic lining (peritoneal cancer). ? Prostate. ? Pancreatic.  Cervical, Uterine, and Ovarian Cancer Your health care provider may recommend that you be screened regularly for cancer of the pelvic organs. These include your ovaries, uterus, and vagina. This screening involves a pelvic exam, which includes checking for microscopic changes to the surface of your cervix (Pap test).  For women ages 21-65, health care providers may recommend a pelvic exam and a Pap test every three years. For women ages 30-65, they may recommend the Pap test and pelvic exam, combined with testing for human papilloma virus (HPV), every five years. Some types of HPV increase your risk of cervical cancer. Testing for HPV may also be done on women of any age who have unclear Pap test results.  Other health care providers may not recommend any screening for nonpregnant women who are considered low risk for pelvic cancer and have no symptoms. Ask your health care provider if a screening pelvic exam is right for you.  If you have had past treatment for cervical cancer or a condition that could lead to cancer, you need Pap tests and screening for cancer for at least 20 years after your treatment. If Pap tests have been discontinued for you, your risk factors (such as having a new sexual  partner) need to be reassessed to determine if you should start having screenings again. Some women have medical problems that increase the chance of getting cervical cancer. In these cases, your health care provider may recommend that you have screening and Pap tests more often.  If you have a family history of uterine cancer or ovarian cancer, talk with your health care provider about genetic screening.  If you have vaginal bleeding after reaching menopause, tell your health care provider.  There are currently no reliable tests available to screen for ovarian cancer.  Lung Cancer Lung cancer screening is recommended for adults 55-80 years old who are at high risk for lung cancer because of a history of smoking. A yearly low-dose CT scan of the lungs is recommended if you:  Currently smoke.  Have a history of at least 30 pack-years of smoking and you currently smoke or have quit within the past 15 years. A pack-year is smoking an average of one pack of cigarettes per day for one year.  Yearly screening should:  Continue until it has been 15 years since you quit.  Stop if you develop a health problem that would prevent you from having lung cancer treatment.  Colorectal Cancer  This type of cancer can be detected and can often be prevented.  Routine colorectal cancer   screening usually begins at age 50 and continues through age 75.  If you have risk factors for colon cancer, your health care provider may recommend that you be screened at an earlier age.  If you have a family history of colorectal cancer, talk with your health care provider about genetic screening.  Your health care provider may also recommend using home test kits to check for hidden blood in your stool.  A small camera at the end of a tube can be used to examine your colon directly (sigmoidoscopy or colonoscopy). This is done to check for the earliest forms of colorectal cancer.  Direct examination of the colon  should be repeated every 5-10 years until age 75. However, if early forms of precancerous polyps or small growths are found or if you have a family history or genetic risk for colorectal cancer, you may need to be screened more often.  Skin Cancer  Check your skin from head to toe regularly.  Monitor any moles. Be sure to tell your health care provider: ? About any new moles or changes in moles, especially if there is a change in a mole's shape or color. ? If you have a mole that is larger than the size of a pencil eraser.  If any of your family members has a history of skin cancer, especially at a Carloyn Lahue age, talk with your health care provider about genetic screening.  Always use sunscreen. Apply sunscreen liberally and repeatedly throughout the day.  Whenever you are outside, protect yourself by wearing long sleeves, pants, a wide-brimmed hat, and sunglasses.  What should I know about osteoporosis? Osteoporosis is a condition in which bone destruction happens more quickly than new bone creation. After menopause, you may be at an increased risk for osteoporosis. To help prevent osteoporosis or the bone fractures that can happen because of osteoporosis, the following is recommended:  If you are 19-50 years old, get at least 1,000 mg of calcium and at least 600 mg of vitamin D per day.  If you are older than age 50 but younger than age 70, get at least 1,200 mg of calcium and at least 600 mg of vitamin D per day.  If you are older than age 70, get at least 1,200 mg of calcium and at least 800 mg of vitamin D per day.  Smoking and excessive alcohol intake increase the risk of osteoporosis. Eat foods that are rich in calcium and vitamin D, and do weight-bearing exercises several times each week as directed by your health care provider. What should I know about how menopause affects my mental health? Depression may occur at any age, but it is more common as you become older. Common symptoms of  depression include:  Low or sad mood.  Changes in sleep patterns.  Changes in appetite or eating patterns.  Feeling an overall lack of motivation or enjoyment of activities that you previously enjoyed.  Frequent crying spells.  Talk with your health care provider if you think that you are experiencing depression. What should I know about immunizations? It is important that you get and maintain your immunizations. These include:  Tetanus, diphtheria, and pertussis (Tdap) booster vaccine.  Influenza every year before the flu season begins.  Pneumonia vaccine.  Shingles vaccine.  Your health care provider may also recommend other immunizations. This information is not intended to replace advice given to you by your health care provider. Make sure you discuss any questions you have with your health care   provider. Document Released: 01/27/2006 Document Revised: 06/24/2016 Document Reviewed: 09/08/2015 Elsevier Interactive Patient Education  2018 [employer].

## 2018-10-01 ENCOUNTER — Telehealth (HOSPITAL_COMMUNITY): Payer: Self-pay

## 2018-10-01 NOTE — Telephone Encounter (Signed)
Pt contacted and instructions given, pt stated she understood and would be here for her appointment. [name] EMTP

## 2018-10-03 MED FILL — PRIMIDONE 50 MG TABLET: 50 | 30 days supply | Qty: 120 | Fill #1

## 2018-10-04 ENCOUNTER — Ambulatory Visit (HOSPITAL_BASED_OUTPATIENT_CLINIC_OR_DEPARTMENT_OTHER): Payer: 59

## 2018-10-04 ENCOUNTER — Ambulatory Visit: Payer: 59

## 2018-10-04 ENCOUNTER — Ambulatory Visit: Payer: 59 | Admitting: Family Medicine

## 2018-10-04 ENCOUNTER — Ambulatory Visit (HOSPITAL_COMMUNITY): Payer: 59 | Attending: Internal Medicine

## 2018-10-04 ENCOUNTER — Other Ambulatory Visit: Payer: Self-pay

## 2018-10-04 DIAGNOSIS — I447 Left bundle-branch block, unspecified: Secondary | ICD-10-CM

## 2018-10-04 DIAGNOSIS — I10 Essential (primary) hypertension: Secondary | ICD-10-CM | POA: Diagnosis not present

## 2018-10-04 LAB — MYOCARDIAL PERFUSION IMAGING
CHL CUP MPHR: 157 {beats}/min
CHL CUP RESTING HR STRESS: 64 {beats}/min
CHL RATE OF PERCEIVED EXERTION: 17
CSEPEDS: 10 s
CSEPHR: 94 %
Estimated workload: 7.2 METS
Exercise duration (min): 6 min
LV sys vol: 23 mL
LVDIAVOL: 75 mL (ref 46–106)
Peak HR: 148 {beats}/min
SDS: 0
SRS: 0
SSS: 0
TID: 0.92

## 2018-10-04 LAB — ECHOCARDIOGRAM COMPLETE
Height: 63 in
Weight: 2352 oz

## 2018-10-04 MED ORDER — PERFLUTREN LIPID MICROSPHERE
1.0000 mL | INTRAVENOUS | Status: AC | PRN
  Administered 2018-10-04: 2 mL via INTRAVENOUS

## 2018-10-04 MED ORDER — TECHNETIUM TC 99M TETROFOSMIN IV KIT
10.2000 | PACK | Freq: Once | INTRAVENOUS | Status: AC | PRN
  Administered 2018-10-04: 10.2 via INTRAVENOUS
  Filled 2018-10-04: qty 11

## 2018-10-04 MED ORDER — TECHNETIUM TC 99M TETROFOSMIN IV KIT
31.2000 | PACK | Freq: Once | INTRAVENOUS | Status: AC | PRN
  Administered 2018-10-04: 31.2 via INTRAVENOUS
  Filled 2018-10-04: qty 32

## 2018-10-09 MED FILL — buPROPion HCL ER (XL) 300 M: 300 | 30 days supply | Qty: 30 | Fill #1

## 2018-10-09 MED FILL — DESVENLAFAXINE SUC ER 100 M: 100 | 30 days supply | Qty: 30 | Fill #1

## 2018-10-17 MED FILL — PANTOPRAZOLE SOD DR 40 MG T: 40 | 30 days supply | Qty: 30 | Fill #0

## 2018-10-19 ENCOUNTER — Encounter: Payer: Self-pay | Admitting: Family Medicine

## 2018-10-19 ENCOUNTER — Other Ambulatory Visit: Payer: Self-pay

## 2018-10-19 ENCOUNTER — Ambulatory Visit: Payer: 59 | Admitting: Family Medicine

## 2018-10-19 VITALS — BP 147/91 | HR 76 | Temp 98.1°F | Resp 16 | Ht 63.0 in | Wt 146.0 lb

## 2018-10-19 DIAGNOSIS — I10 Essential (primary) hypertension: Secondary | ICD-10-CM

## 2018-10-19 DIAGNOSIS — F331 Major depressive disorder, recurrent, moderate: Secondary | ICD-10-CM

## 2018-10-19 DIAGNOSIS — I7300 Raynaud's syndrome without gangrene: Secondary | ICD-10-CM

## 2018-10-19 HISTORY — DX: Raynaud's syndrome without gangrene: I73.00

## 2018-10-19 MED ORDER — AMLODIPINE BESYLATE 5 MG PO TABS: 5.0000 mg | ORAL_TABLET | Freq: Every day | ORAL | 3 refills | Status: DC

## 2018-10-19 MED FILL — AMLODIPINE BESYLATE 5 MG TA: 5 | 30 days supply | Qty: 30 | Fill #0

## 2018-10-19 NOTE — Assessment & Plan Note (Signed)
Followed by psych

## 2018-10-19 NOTE — Assessment & Plan Note (Signed)
New.  Pt's sxs are consistent w/ dx.  Start Amlodipine for both BP and Raynaud's.  Pt expressed understanding and is in agreement w/ plan.

## 2018-10-19 NOTE — Patient Instructions (Signed)
Follow up in 1 month to recheck BP and Raynaud's CONTINUE the Lisinopril daily ADD the Amlodipine once daily to improve both BP and Raynaud's Call with any questions or concerns Have a great weekend!

## 2018-10-19 NOTE — Assessment & Plan Note (Signed)
Deteriorated.  Asymptomatic.  Will add Amlodipine to Lisinopril and monitor for improvement.  Pt expressed understanding and is in agreement w/ plan.

## 2018-10-19 NOTE — Progress Notes (Signed)
   Subjective:    Patient ID: Audrey Baxter, female    DOB: 08-Jan-1956, 62 y.o.   MRN: [id number]  HPI HTN- chronic problem, on Lisinopril 10mg  daily.  Home BPs have been elevated recently- 150s-160s/90s.  Had complete cardiac w/u that was WNL.  Cards told her to f/u w/ PCP.  No CP, SOB, HAs, visual changes, edema.    Raynaud's- new.  Mom and brother have also been dx'd.  Pt reports sxs 'get so much worse in cold weather'.     Review of Systems For ROS see HPI     Objective:   Physical Exam  Constitutional: She is oriented to person, place, and time. She appears well-developed and well-nourished. No distress.  HENT:  Head: Normocephalic and atraumatic.  Eyes: Pupils are equal, round, and reactive to light. Conjunctivae and EOM are normal.  Neck: Normal range of motion. Neck supple. No thyromegaly present.  Cardiovascular: Normal rate, regular rhythm, normal heart sounds and intact distal pulses.  No murmur heard. Pulmonary/Chest: Effort normal and breath sounds normal. No respiratory distress.  Abdominal: Soft. She exhibits no distension. There is no tenderness.  Musculoskeletal: She exhibits no edema.  Lymphadenopathy:    She has no cervical adenopathy.  Neurological: She is alert and oriented to person, place, and time.  Skin: Skin is warm and dry.  Psychiatric: She has a normal mood and affect. Her behavior is normal.  Vitals reviewed.         Assessment & Plan:

## 2018-11-03 MED FILL — PRIMIDONE 50 MG TABLET: 50 | 30 days supply | Qty: 120 | Fill #2

## 2018-11-08 MED FILL — DESVENLAFAXINE SUC ER 100 M: 100 | 30 days supply | Qty: 30 | Fill #2

## 2018-11-08 MED FILL — buPROPion HCL ER (XL) 300 M: 300 | 30 days supply | Qty: 30 | Fill #2

## 2018-11-12 MED FILL — clonazePAM 0.5 MG TABS: 0.5 | 10 days supply | Qty: 30 | Fill #0

## 2018-11-13 MED FILL — AMLODIPINE BESYLATE 5 MG TA: 5 | 30 days supply | Qty: 30 | Fill #1

## 2018-11-21 ENCOUNTER — Ambulatory Visit: Payer: 59 | Admitting: Family Medicine

## 2018-11-21 DIAGNOSIS — Z0289 Encounter for other administrative examinations: Secondary | ICD-10-CM

## 2018-12-04 ENCOUNTER — Other Ambulatory Visit: Payer: Self-pay | Admitting: Family Medicine

## 2018-12-04 MED FILL — LISINOPRIL 10 MG TABLET: 10 | 30 days supply | Qty: 30 | Fill #1

## 2018-12-04 MED FILL — PANTOPRAZOLE SOD DR 40 MG T: 40 | 30 days supply | Qty: 30 | Fill #1

## 2018-12-04 MED FILL — ROSUVASTATIN CALCIUM 10 MG: 10 | 30 days supply | Qty: 30 | Fill #0

## 2018-12-04 MED FILL — buPROPion HCL ER (XL) 300 M: 300 | 30 days supply | Qty: 30 | Fill #0

## 2018-12-09 MED FILL — PRIMIDONE 50 MG TABLET: 50 | 30 days supply | Qty: 120 | Fill #3

## 2018-12-10 MED FILL — AMLODIPINE BESYLATE 5 MG TA: 5 | 30 days supply | Qty: 30 | Fill #2

## 2018-12-10 MED FILL — DESVENLAFAXINE SUC ER 100 M: 100 | 30 days supply | Qty: 30 | Fill #0

## 2018-12-17 ENCOUNTER — Telehealth: Payer: Self-pay | Admitting: Interventional Cardiology

## 2018-12-17 DIAGNOSIS — I10 Essential (primary) hypertension: Secondary | ICD-10-CM

## 2018-12-17 MED ORDER — LISINOPRIL-HYDROCHLOROTHIAZIDE 10-12.5 MG PO TABS: 1.0000 | ORAL_TABLET | Freq: Every day | ORAL | 3 refills | Status: DC

## 2018-12-17 MED FILL — LISINOPRIL-HCTZ 10-12.5 MG: 10-12.5 | 30 days supply | Qty: 30 | Fill #0

## 2018-12-17 NOTE — Telephone Encounter (Signed)
Spoke with pt and went over recommendations per Dr. Tamala Baxter.  Pt agreeable to plan.  Will have labs drawn 12/26/17.      Somehow, I missed this phone message. Change lisinopril to Lisinopril HCTZ 10/12.5 mg daily. BMET 1 week.  ----- Message -----  From: Audrey Racer, LPN  Sent: 17/71/1657 12:56 PM EST  To: Audrey Crome, MD  Subject: Audrey Baxter: Visit Follow-Up Question                 ----- Message -----  From: Audrey Baxter  Sent: 11/27/2018 12:55 PM EST  To: [facility] Triage  Subject: Visit Follow-Up Question               Dr. Tamala Baxter, my BP is consistently running in the range of 160/90, sometimes higher, sometimes a bit lower. My current GP does not seem concerned, but I am, so I am switching GPs but won't be able to see the new one until Feb 10. I have a trip abroad scheduled in the meantime and would like to get this under control. I am currently taking lisinopril 10 mg and amlodipine 5 mg. I really think I need to be on something stronger. Can you prescribe it? I use the [facility].

## 2018-12-18 IMAGING — MG 2D DIGITAL SCREENING BILATERAL MAMMOGRAM WITH 3D TOMO WITH CAD
8 of 12 series · 8 of 28 positions shown · non-contrast
Comparison: Previous exam(s).

CLINICAL DATA: Screening.

EXAM:
2D DIGITAL SCREENING BILATERAL MAMMOGRAM WITH 3D TOMO WITH CAD

[L CC]
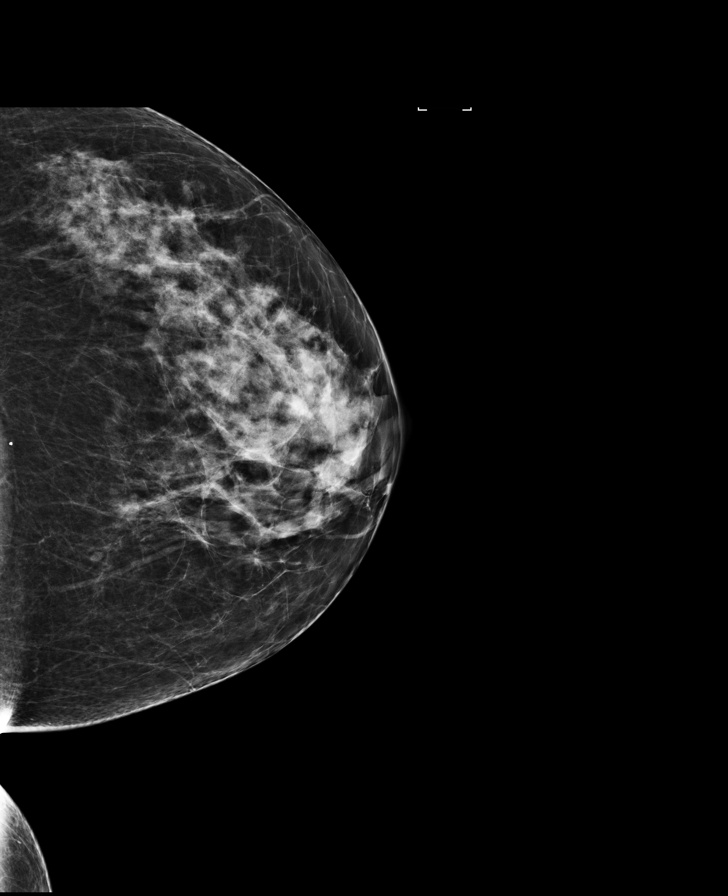

[R MLO synth-2D]
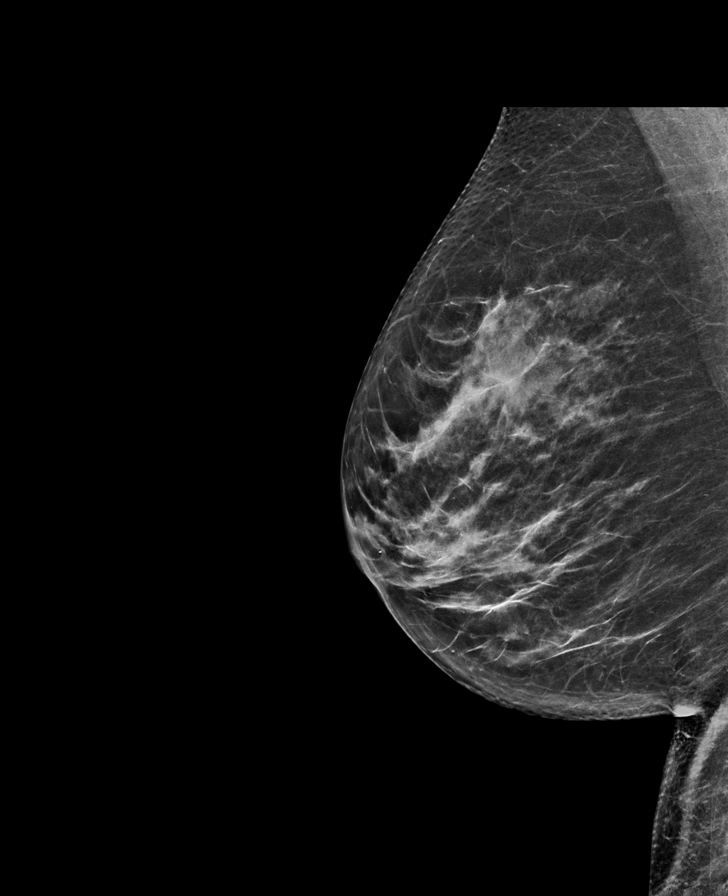

[L MLO synth-2D]
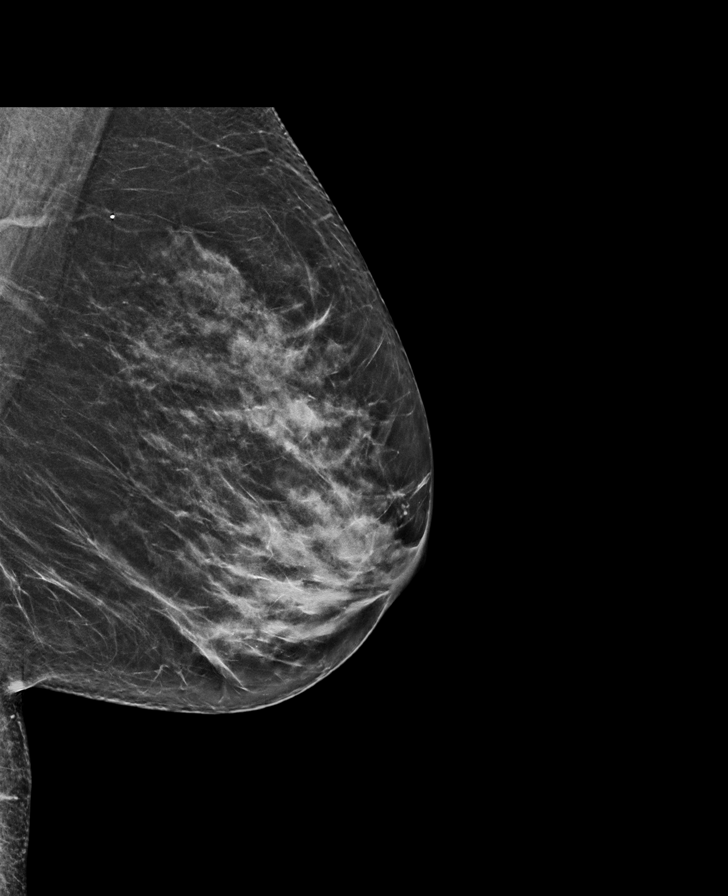

[L MLO]
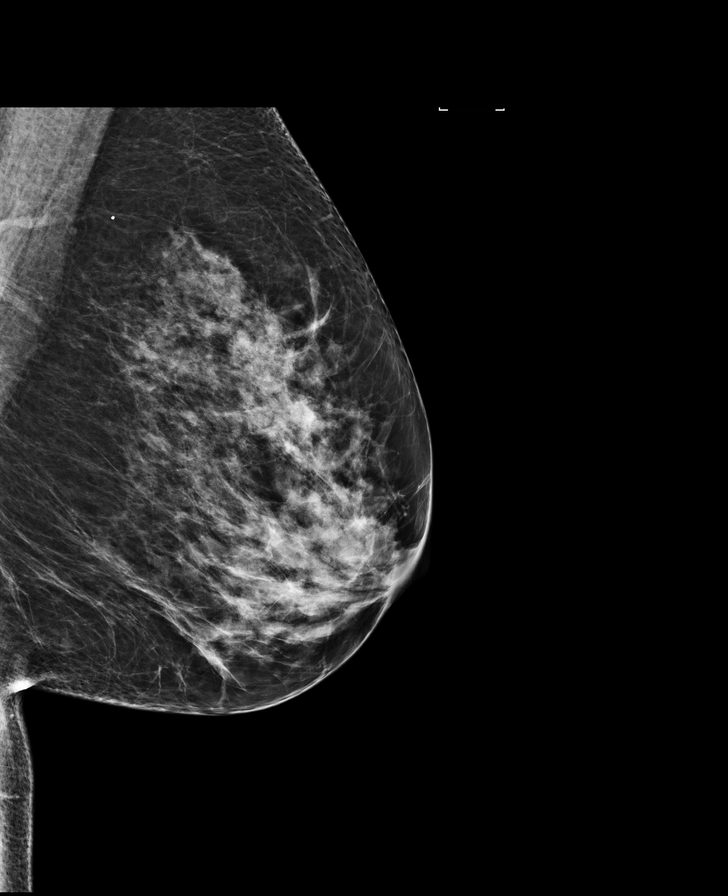

[R CC synth-2D]
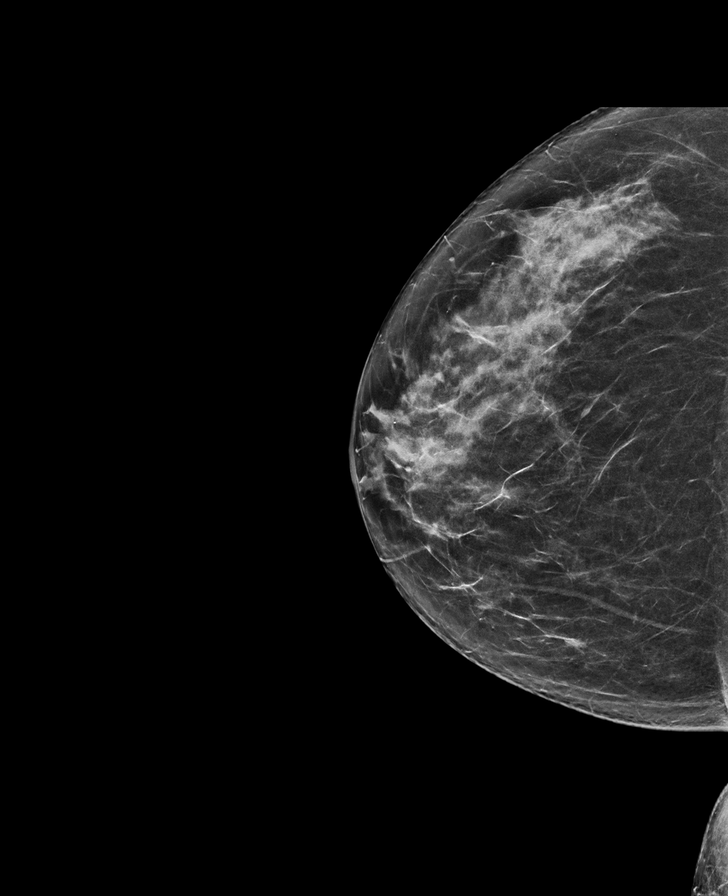

[R CC]
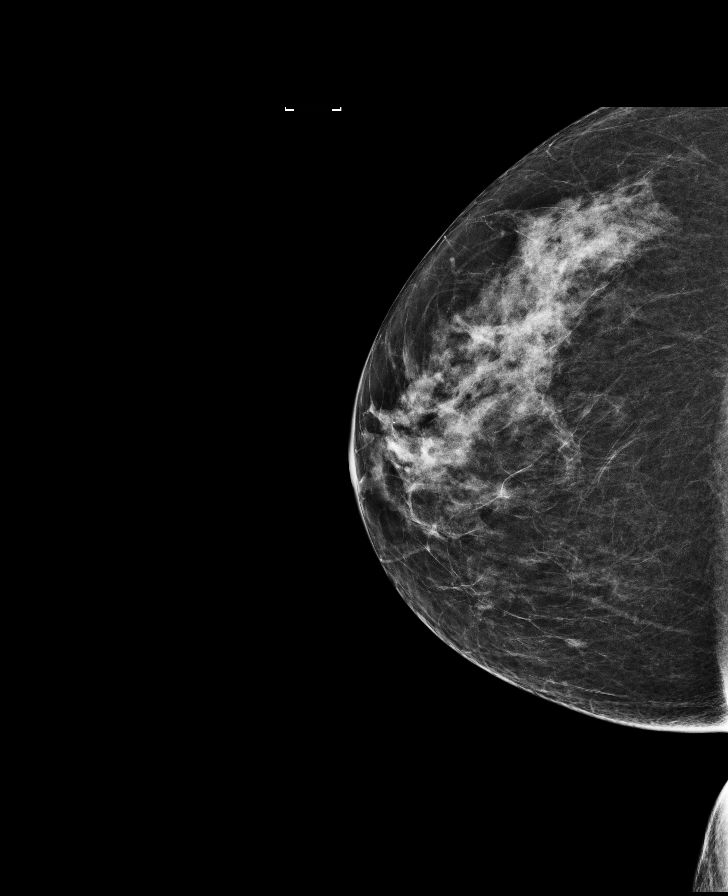

[L CC synth-2D]
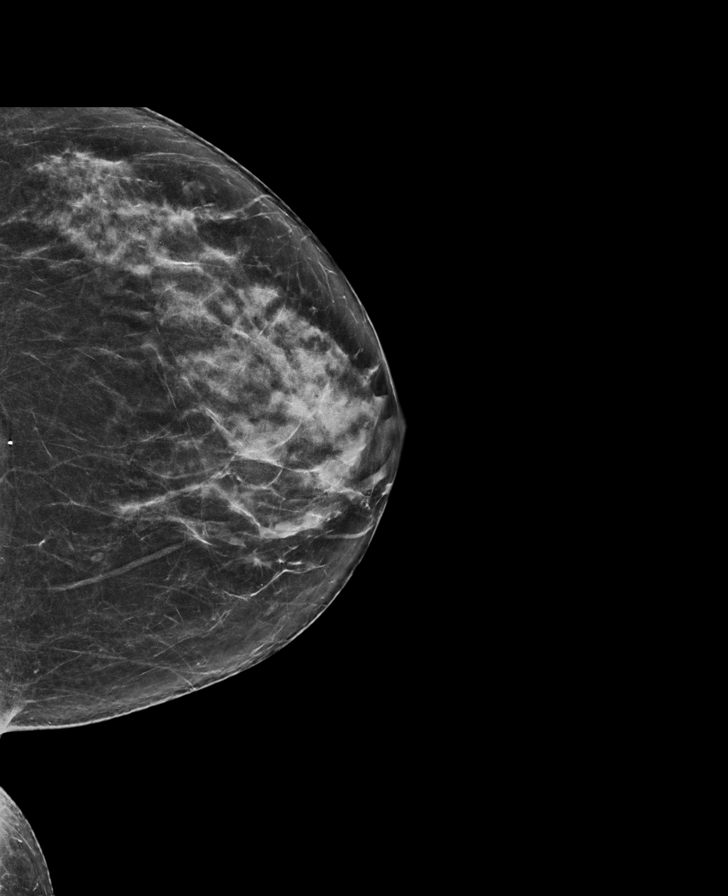

[R MLO]
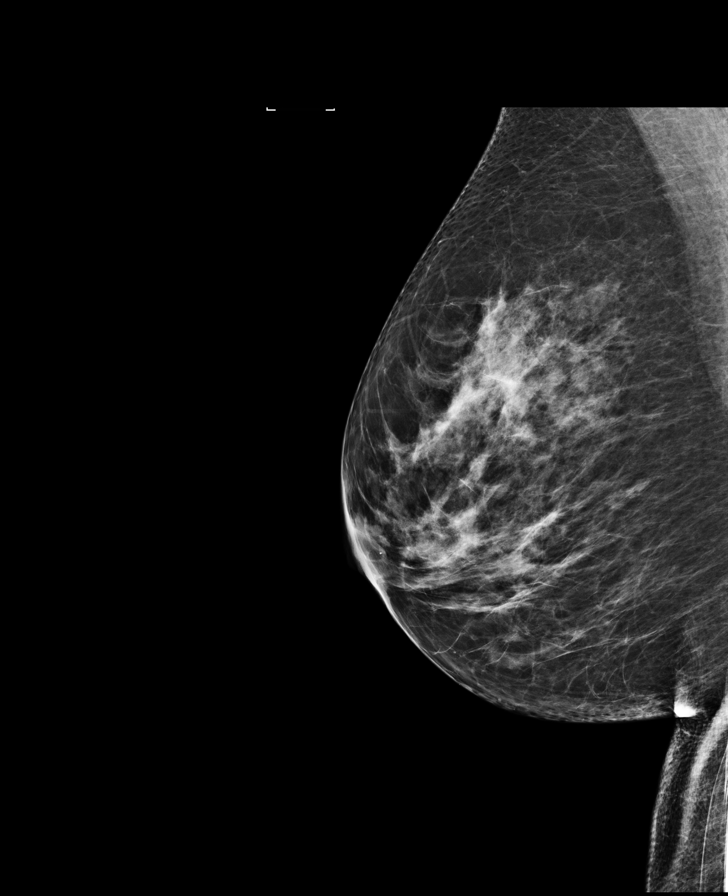

[8 of 28 positions shown; findings below may reference images not displayed]

ACR Breast Density Category c: The breast tissue is heterogeneously
dense, which may obscure small masses.
FINDINGS: There are no findings suspicious for malignancy. Images were
processed with CAD.
IMPRESSION: No mammographic evidence of malignancy. A result letter of this
screening mammogram will be mailed directly to the patient.

RECOMMENDATION:
Screening mammogram in one year. (Code:[id number])

BI-RADS CATEGORY  1: Negative.

## 2018-12-20 ENCOUNTER — Encounter: Payer: Self-pay | Admitting: Family Medicine

## 2018-12-20 ENCOUNTER — Other Ambulatory Visit: Payer: Self-pay | Admitting: *Deleted

## 2018-12-20 MED ORDER — LISINOPRIL-HYDROCHLOROTHIAZIDE 10-12.5 MG PO TABS: 1.0000 | ORAL_TABLET | Freq: Every day | ORAL | 3 refills | Status: DC

## 2018-12-20 MED ORDER — ROSUVASTATIN CALCIUM 10 MG PO TABS: 10.0000 mg | ORAL_TABLET | Freq: Every day | ORAL | 1 refills | Status: DC

## 2018-12-20 MED ORDER — AMLODIPINE BESYLATE 5 MG PO TABS: 5.0000 mg | ORAL_TABLET | Freq: Every day | ORAL | 1 refills | Status: DC

## 2018-12-20 MED ORDER — PANTOPRAZOLE SODIUM 40 MG PO TBEC: 40.0000 mg | DELAYED_RELEASE_TABLET | Freq: Every day | ORAL | 1 refills | Status: DC

## 2018-12-26 ENCOUNTER — Other Ambulatory Visit: Payer: 59 | Admitting: *Deleted

## 2018-12-26 DIAGNOSIS — I10 Essential (primary) hypertension: Secondary | ICD-10-CM | POA: Diagnosis not present

## 2018-12-26 LAB — BASIC METABOLIC PANEL WITH GFR
BUN/Creatinine Ratio: 14 (ref 12–28)
BUN: 10 mg/dL (ref 8–27)
CO2: 25 mmol/L (ref 20–29)
Calcium: 9.7 mg/dL (ref 8.7–10.3)
Chloride: 91 mmol/L — ABNORMAL LOW (ref 96–106)
Creatinine, Ser: 0.7 mg/dL (ref 0.57–1.00)
GFR calc Af Amer: 106 mL/min/1.73
GFR calc non Af Amer: 92 mL/min/1.73
Glucose: 80 mg/dL (ref 65–99)
Potassium: 4.4 mmol/L (ref 3.5–5.2)
Sodium: 128 mmol/L — ABNORMAL LOW (ref 134–144)

## 2018-12-26 NOTE — Telephone Encounter (Signed)
Pt sent a my chart to report that her BP readings have still been elevated and is getting ready to pick uo her Lisinopril/HCTZ 10/12.5 and is going to be traveling to [place] soon and was wondering if Dr. Tamala Julian would like to increase the dose prior to her leaving... she says that she has been on Lisinopril 20mg  in the past and did well on it... BP around 8:30am in the mornings, noon, and at night: 144/87, 150/81, 147/79, 153/88, and today 150/83.  Advised her that I will forward her messages to Dr. Willeen Cass and let her know his recommendations.

## 2018-12-27 MED ORDER — AMLODIPINE BESYLATE 10 MG PO TABS: 10.0000 mg | ORAL_TABLET | Freq: Every day | ORAL | 3 refills | Status: DC

## 2018-12-27 MED FILL — LISINOPRIL-HCTZ 10-12.5 MG: 10-12.5 | 30 days supply | Qty: 30 | Fill #0

## 2018-12-27 MED FILL — DESVENLAFAXINE SUC ER 100 M: 100 | 30 days supply | Qty: 30 | Fill #1

## 2018-12-27 MED FILL — AMLODIPINE BESYLATE 10 MG T: 10 | 30 days supply | Qty: 30 | Fill #0

## 2018-12-27 MED FILL — ROSUVASTATIN CALCIUM 10 MG: 10 | 30 days supply | Qty: 30 | Fill #1

## 2018-12-27 MED FILL — clonazePAM 0.5 MG TABS: 0.5 | 10 days supply | Qty: 30 | Fill #0

## 2018-12-27 MED FILL — buPROPion HCL ER (XL) 300 M: 300 | 30 days supply | Qty: 30 | Fill #1

## 2018-12-27 NOTE — Telephone Encounter (Signed)
Spoke with the pt and advised her Dr. Thompson Caul recommendations... also advised her to review her MY Chart.Audrey Baxter, Dr. Thompson Caul nurse has replied to her My Chart messages. Pt verbalized understanding and will let us know in a few days if she has any further problem,s with her BP... she is traveling out of the country for 2 weeks on 01/02/19.

## 2018-12-28 ENCOUNTER — Other Ambulatory Visit: Payer: Self-pay | Admitting: Neurology

## 2018-12-28 MED FILL — PANTOPRAZOLE SOD DR 40 MG T: 40 | 30 days supply | Qty: 30 | Fill #2

## 2018-12-31 ENCOUNTER — Telehealth: Payer: Self-pay | Admitting: Neurology

## 2018-12-31 ENCOUNTER — Other Ambulatory Visit: Payer: Self-pay | Admitting: Neurology

## 2018-12-31 MED ORDER — PRIMIDONE 50 MG PO TABS: 100.0000 mg | ORAL_TABLET | Freq: Two times a day (BID) | ORAL | 0 refills | Status: AC

## 2018-12-31 MED FILL — PRIMIDONE 50 MG TABLET: 50 | 30 days supply | Qty: 120 | Fill #0

## 2018-12-31 NOTE — Telephone Encounter (Signed)
Spoke with patient and advised that refills were being denied because last note states she was getting future refills from PCP. Agreed to refill one month since she is going out of town and last seen within the past year. She will contact PCP for future refills.

## 2018-12-31 NOTE — Telephone Encounter (Signed)
Patient left vm about having prescription questions and she said she is leaving the country. Please call her back at [phone]. Thanks!

## 2019-01-23 MED FILL — AMLODIPINE BESYLATE 10 MG T: 10 | 30 days supply | Qty: 30 | Fill #1

## 2019-01-23 MED FILL — ARIPiprazole 5 MG TABS: 5 | 30 days supply | Qty: 20 | Fill #0

## 2019-01-23 MED FILL — ZOLPIDEM TARTRATE 10 MG TAB: 10 | 30 days supply | Qty: 30 | Fill #0

## 2019-01-23 MED FILL — buPROPion HCL ER (XL) 300 M: 300 | 30 days supply | Qty: 30 | Fill #0

## 2019-01-23 MED FILL — DESVENLAFAXINE SUC ER 100 M: 100 | 30 days supply | Qty: 30 | Fill #0

## 2019-01-28 ENCOUNTER — Other Ambulatory Visit: Payer: Self-pay

## 2019-01-28 ENCOUNTER — Other Ambulatory Visit: Payer: Self-pay | Admitting: Emergency Medicine

## 2019-01-28 ENCOUNTER — Other Ambulatory Visit: Payer: Self-pay | Admitting: Interventional Cardiology

## 2019-01-28 DIAGNOSIS — G250 Essential tremor: Secondary | ICD-10-CM | POA: Diagnosis not present

## 2019-01-28 DIAGNOSIS — I10 Essential (primary) hypertension: Secondary | ICD-10-CM | POA: Diagnosis not present

## 2019-01-28 DIAGNOSIS — E785 Hyperlipidemia, unspecified: Secondary | ICD-10-CM

## 2019-01-28 DIAGNOSIS — E782 Mixed hyperlipidemia: Secondary | ICD-10-CM | POA: Diagnosis not present

## 2019-01-28 MED ORDER — ESTRADIOL 2 MG VA RING: 2.0000 mg | VAGINAL_RING | VAGINAL | 3 refills | Status: AC

## 2019-01-28 MED ORDER — AMLODIPINE BESYLATE 10 MG PO TABS: 10.0000 mg | ORAL_TABLET | Freq: Every day | ORAL | 2 refills | Status: DC

## 2019-01-28 MED ORDER — LISINOPRIL-HYDROCHLOROTHIAZIDE 10-12.5 MG PO TABS: 1.0000 | ORAL_TABLET | Freq: Every day | ORAL | 2 refills | Status: AC

## 2019-01-28 MED ORDER — ROSUVASTATIN CALCIUM 10 MG PO TABS: 10.0000 mg | ORAL_TABLET | Freq: Every day | ORAL | 1 refills | Status: AC

## 2019-01-28 NOTE — Telephone Encounter (Signed)
Okay for refill?  

## 2019-02-14 DIAGNOSIS — E871 Hypo-osmolality and hyponatremia: Secondary | ICD-10-CM | POA: Diagnosis not present

## 2019-02-18 MED FILL — LISINOPRIL 20 MG TABLET: 20 | 30 days supply | Qty: 30 | Fill #0

## 2019-02-21 MED FILL — PROGESTERONE 200 MG CAPSULE: 200 | 31 days supply | Qty: 10 | Fill #0

## 2019-02-21 MED FILL — PRIMIDONE 50 MG TABLET: 50 | 30 days supply | Qty: 120 | Fill #0

## 2019-02-26 ENCOUNTER — Other Ambulatory Visit: Payer: Self-pay | Admitting: General Practice

## 2019-02-26 MED ORDER — PANTOPRAZOLE SODIUM 40 MG PO TBEC: 40.0000 mg | DELAYED_RELEASE_TABLET | Freq: Every day | ORAL | 1 refills | Status: DC

## 2019-02-28 MED FILL — buPROPion HCL ER (XL) 300 M: 300 | 30 days supply | Qty: 30 | Fill #1

## 2019-02-28 MED FILL — DESVENLAFAXINE SUC ER 100 M: 100 | 30 days supply | Qty: 30 | Fill #1

## 2019-03-01 DIAGNOSIS — L570 Actinic keratosis: Secondary | ICD-10-CM | POA: Diagnosis not present

## 2019-03-01 DIAGNOSIS — L812 Freckles: Secondary | ICD-10-CM | POA: Diagnosis not present

## 2019-03-01 DIAGNOSIS — L821 Other seborrheic keratosis: Secondary | ICD-10-CM | POA: Diagnosis not present

## 2019-03-01 DIAGNOSIS — D2271 Melanocytic nevi of right lower limb, including hip: Secondary | ICD-10-CM | POA: Diagnosis not present

## 2019-03-05 DIAGNOSIS — I10 Essential (primary) hypertension: Secondary | ICD-10-CM | POA: Diagnosis not present

## 2019-03-05 MED FILL — ARIPiprazole 5 MG TABS: 5 | 30 days supply | Qty: 20 | Fill #1

## 2019-03-05 MED FILL — clonazePAM 0.5 MG TABS: 0.5 | 30 days supply | Qty: 90 | Fill #0

## 2019-03-18 MED FILL — PROPRANOLOL 20 MG TABLET: 20 | 30 days supply | Qty: 30 | Fill #1

## 2019-04-02 MED FILL — LISINOPRIL 20 MG TABLET: 20 | 30 days supply | Qty: 30 | Fill #1

## 2019-04-24 DIAGNOSIS — E871 Hypo-osmolality and hyponatremia: Secondary | ICD-10-CM | POA: Diagnosis not present

## 2019-05-02 MED FILL — LISINOPRIL 20 MG TABLET: 20 | 30 days supply | Qty: 30 | Fill #0

## 2019-05-31 MED FILL — LISINOPRIL 20 MG TABLET: 20 | 30 days supply | Qty: 30 | Fill #1

## 2019-06-10 ENCOUNTER — Other Ambulatory Visit: Payer: Self-pay | Admitting: Family Medicine

## 2019-07-02 MED FILL — LISINOPRIL 20 MG TABLET: 20 | 30 days supply | Qty: 30 | Fill #2

## 2019-07-04 MED FILL — MELOXICAM 15 MG TABLET: 15 | 30 days supply | Qty: 30 | Fill #0

## 2019-07-22 IMAGING — CR DG CHEST 2V
2 series · 2 of 2 positions shown · non-contrast
Comparison: None.

CLINICAL DATA: patient states she took her BP at home and it was
222/118. Patient called her doctor instructed to the patient to come
to the ED. Patient was taken off of Lisinopril in March 2018.
Patient states she has felt flushed and jittery all day.

EXAM:
CHEST - 2 VIEW

[w chest pa]
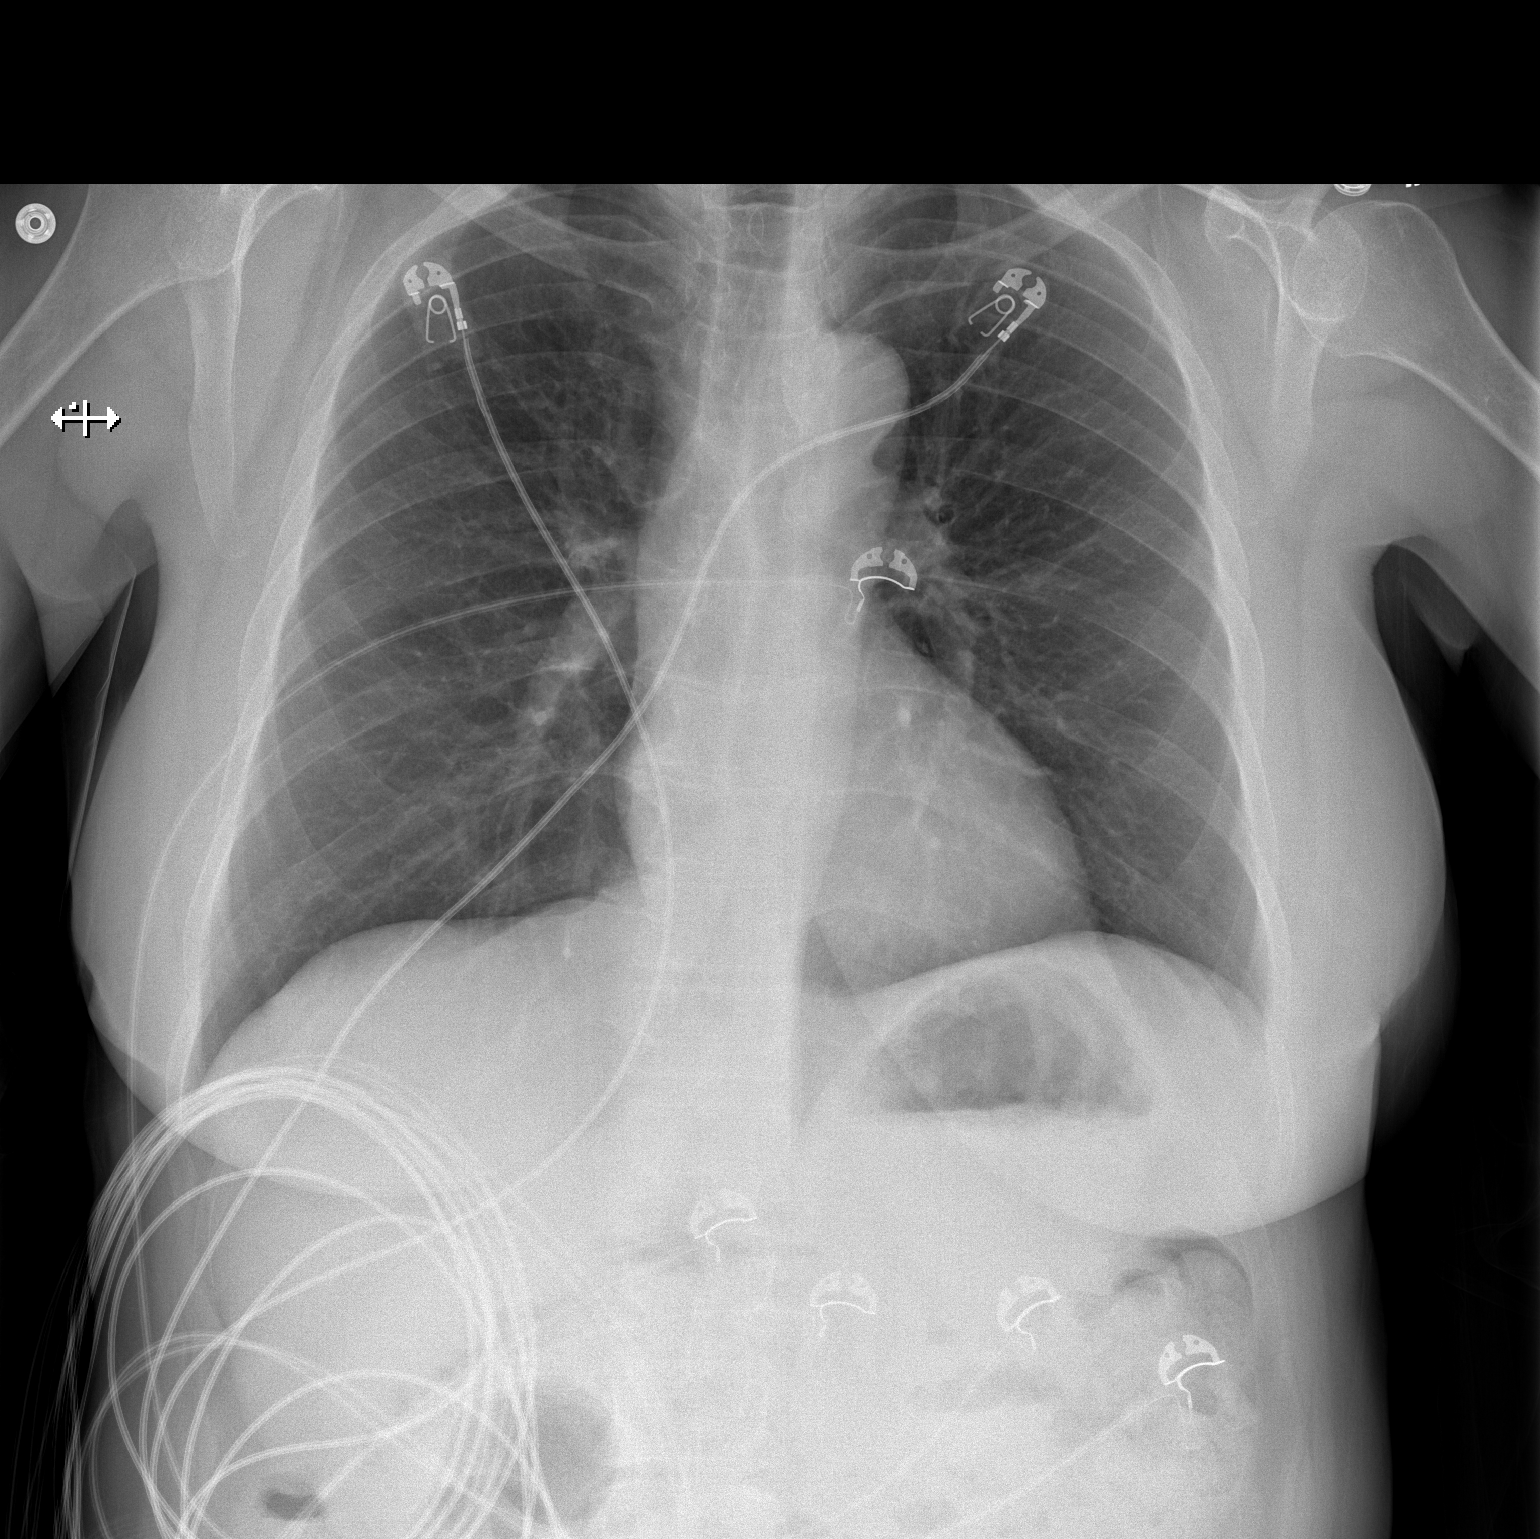

[w chest lat]
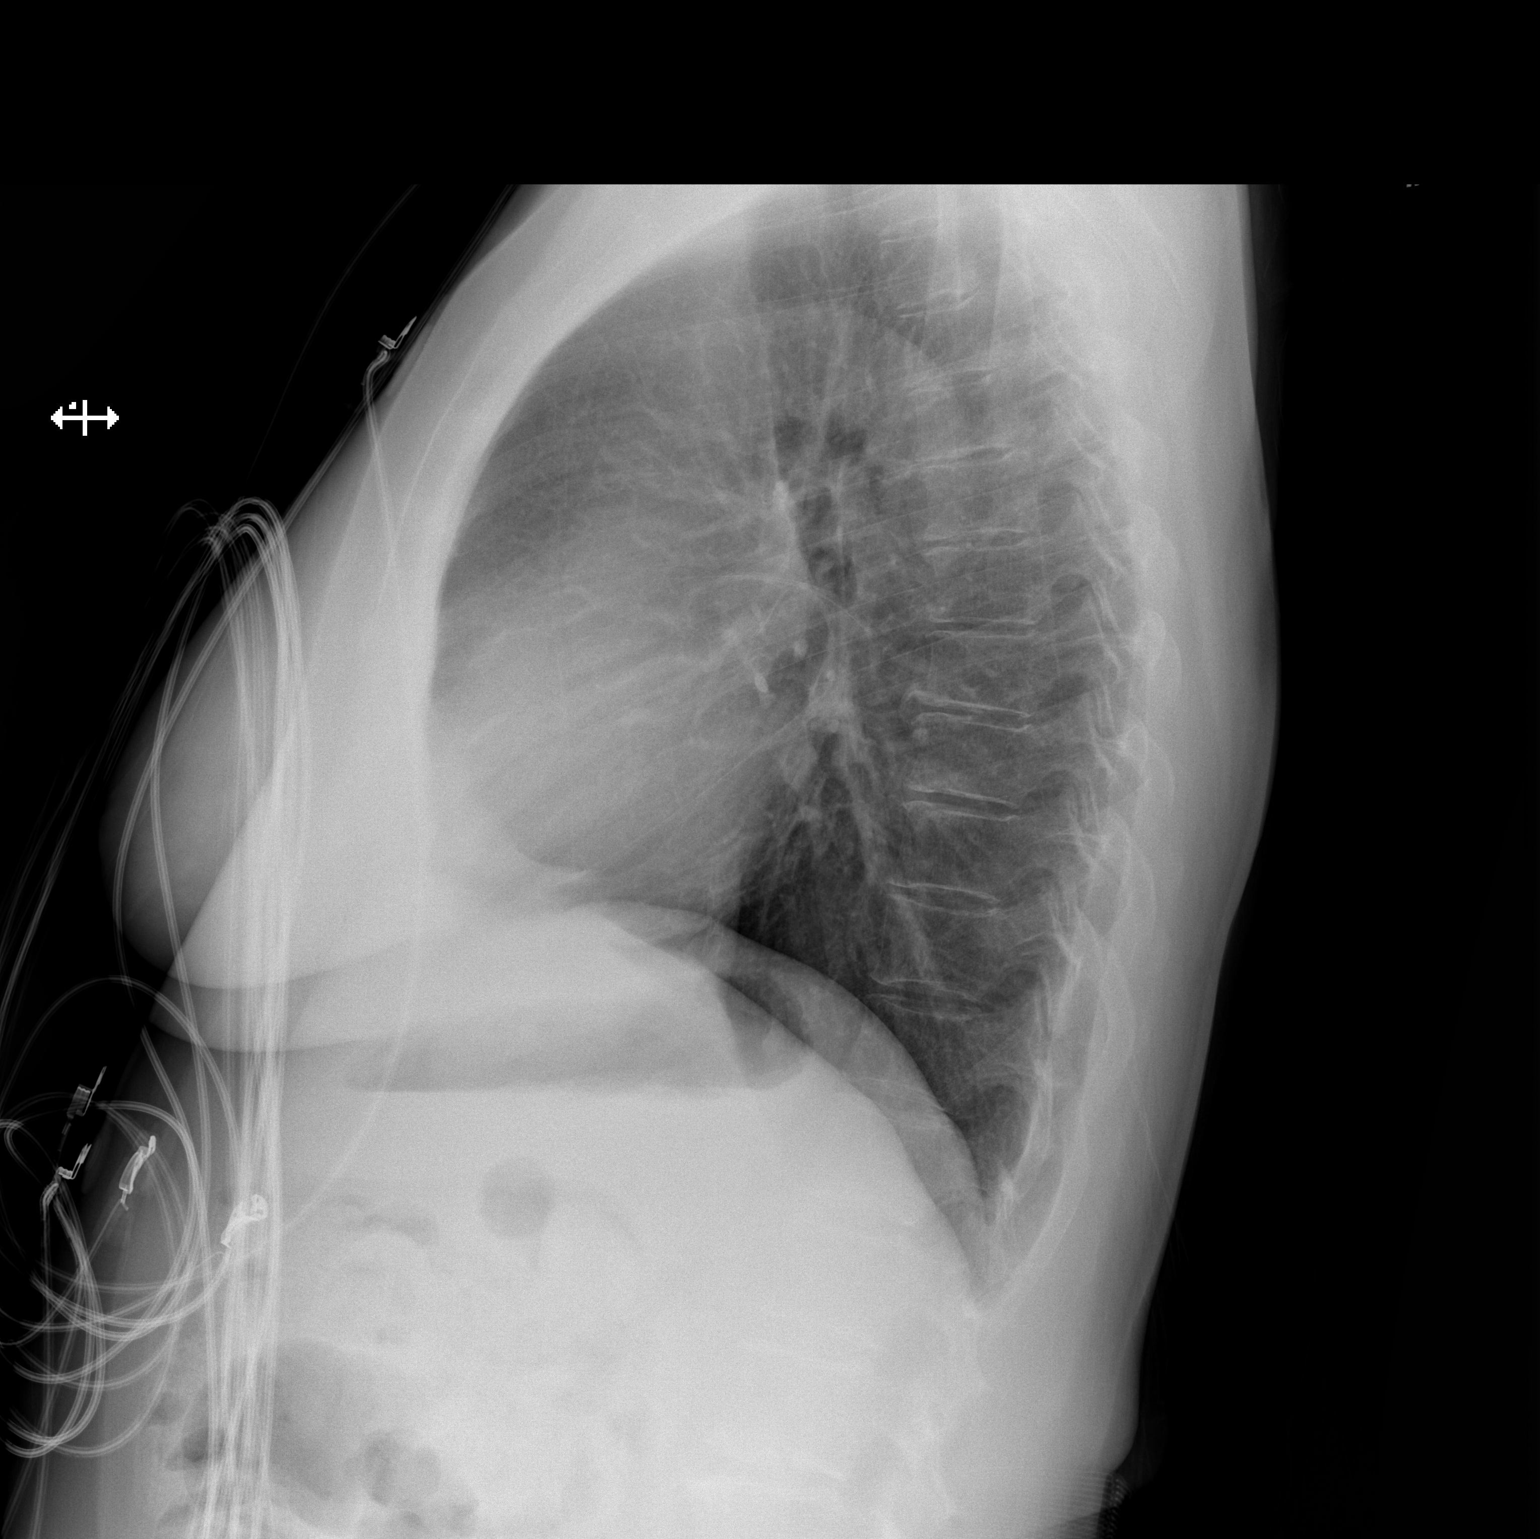

[2 of 2 positions shown; findings below may reference images not displayed]

FINDINGS: The heart size and mediastinal contours are within normal limits.
Both lungs are clear. The visualized skeletal structures are
unremarkable.
IMPRESSION: No active cardiopulmonary disease.

## 2019-08-02 MED FILL — LISINOPRIL 20 MG TABLET: 20 | 30 days supply | Qty: 30 | Fill #3

## 2019-08-13 MED FILL — MELOXICAM 15 MG TABLET: 15 | 30 days supply | Qty: 30 | Fill #0

## 2019-08-13 MED FILL — PROGESTERONE 200 MG CAPSULE: 200 | 31 days supply | Qty: 10 | Fill #0

## 2019-08-27 MED FILL — LISINOPRIL 20 MG TABLET: 20 | 30 days supply | Qty: 30 | Fill #4

## 2019-08-29 ENCOUNTER — Other Ambulatory Visit: Payer: Self-pay | Admitting: Family Medicine

## 2019-08-29 DIAGNOSIS — M8588 Other specified disorders of bone density and structure, other site: Secondary | ICD-10-CM

## 2019-09-03 ENCOUNTER — Other Ambulatory Visit: Payer: Self-pay | Admitting: Family Medicine

## 2019-09-03 DIAGNOSIS — R0989 Other specified symptoms and signs involving the circulatory and respiratory systems: Secondary | ICD-10-CM

## 2019-09-04 ENCOUNTER — Other Ambulatory Visit: Payer: Self-pay | Admitting: Family Medicine

## 2019-09-04 DIAGNOSIS — Z1231 Encounter for screening mammogram for malignant neoplasm of breast: Secondary | ICD-10-CM

## 2019-09-10 ENCOUNTER — Ambulatory Visit
Admission: RE | Admit: 2019-09-10 | Discharge: 2019-09-10 | Disposition: A | Discharge status: Home / Self Care | Payer: 59 | Source: Ambulatory Visit | Attending: Family Medicine | Admitting: Family Medicine

## 2019-09-10 DIAGNOSIS — R0989 Other specified symptoms and signs involving the circulatory and respiratory systems: Secondary | ICD-10-CM

## 2019-09-11 ENCOUNTER — Other Ambulatory Visit: Payer: Self-pay | Admitting: Interventional Cardiology

## 2019-10-21 ENCOUNTER — Other Ambulatory Visit: Payer: Self-pay | Admitting: Interventional Cardiology

## 2019-10-21 MED ORDER — AMLODIPINE BESYLATE 10 MG PO TABS: 10.0000 mg | ORAL_TABLET | Freq: Every day | ORAL | 0 refills | Status: DC

## 2019-11-20 ENCOUNTER — Ambulatory Visit
Admission: RE | Admit: 2019-11-20 | Discharge: 2019-11-20 | Disposition: A | Discharge status: Home / Self Care | Payer: Medicare Other | Source: Ambulatory Visit | Attending: Family Medicine | Admitting: Family Medicine

## 2019-11-20 ENCOUNTER — Other Ambulatory Visit: Payer: Self-pay

## 2019-11-20 DIAGNOSIS — Z1231 Encounter for screening mammogram for malignant neoplasm of breast: Secondary | ICD-10-CM

## 2019-11-20 DIAGNOSIS — M8588 Other specified disorders of bone density and structure, other site: Secondary | ICD-10-CM

## 2019-12-25 ENCOUNTER — Other Ambulatory Visit: Payer: Self-pay

## 2019-12-25 ENCOUNTER — Ambulatory Visit: Payer: Medicare Other | Attending: Family Medicine

## 2019-12-25 DIAGNOSIS — M545 Low back pain, unspecified: Secondary | ICD-10-CM

## 2019-12-25 DIAGNOSIS — M6281 Muscle weakness (generalized): Secondary | ICD-10-CM | POA: Diagnosis present

## 2019-12-25 DIAGNOSIS — R293 Abnormal posture: Secondary | ICD-10-CM

## 2019-12-25 NOTE — Patient Instructions (Addendum)
DO's and DON'T's   Avoid and/or Minimize positions of forward bending ( flexion)  Side bending and rotation of the trunk  Especially when movements occur together   When your back aches:   Don't sit down   Lie down on your back with a small pillow under your head and one under your knees or as outlined by our therapist. Or, lie in the 90/90 position ( on the floor with your feet and legs on the sofa with knees and hips bent to 90 degrees)  Tying or putting on your shoes:   Don't bend over to tie your shoes or put on socks.  Instead, bring one foot up, cross it over the opposite knee and bend forward (hinge) at the hips to so the task.  Keep your back straight.  If you cannot do this safely, then you need to use long handled assistive devices such as a shoehorn and sock puller.  Exercising:  Don't engage in ballistic types of exercise routines such as high-impact aerobics or jumping rope  Don't do exercises in the gym that bring you forward (abdominal crunches, sit-ups, touching your  toes, knee-to-chest, straight leg raising.)  Follow a regular exercise program that includes a variety of different weight-bearing activities, such as low-impact aerobics, T' ai chi or walking as your physical therapist advises  Do exercises that emphasize return to normal body alignment and strengthening of the muscles that keep your back straight, as outlined in this program or by your therapist  Household tasks:  Don't reach unnecessarily or twist your trunk when mopping, sweeping, vacuuming, raking, making beds, weeding gardens, getting objects ou of cupboards, etc.  Keep your broom, mop, vacuum, or rake close to you and mover your whole body as you move them. Walk over to the area on which you are working. Arrange kitchen, bathroom, and bedroom shelves so that frequently used items may be reached without excessive bending, twisting, and reaching.  Use a  sturdy stool if necessary.  Don't bend from the waist to pick up something up  Off the floor, out of the trunk of your car, or to brush your teeth, wash your face, etc.   Bend at the knees, keeping back straight as possible. Use a reacher if necessary.   Prevention of fracture is the so-called "BOTTOm -Line" in the management of OSTEOPOROSIS. Do not take unnecessary chances in movement. Once a compression fracture occurs, the process is very difficult to control; one fracture is frequently followed by many more.   Access Code: [email or web address]  URL: [email or web address]  Date: 12/25/2019  Prepared by: Sigurd Sos   Exercises Seated Shoulder Abduction - Palms Down - 10 reps - 2 sets - 2x daily - 7x weekly Seated Shoulder Flexion - 10 reps - 2 sets - 2x daily - 7x weekly Seated Shoulder Scaption - 10 reps - 2 sets - 2x daily - 7x weekly Seated Correct Posture - 10 reps - 3 sets - 1x daily - 7x weekly Seated Hamstring Stretch - 10 reps - 3 sets - 1x daily - 7x weekly Supine Transversus Abdominis Bracing - Hands on Stomach - 10 reps - 3 sets - 1x daily - 7x weekly Seated Transversus Abdominis Bracing - 10 reps - 3 sets - 1x daily - 7x weekly [facility] [address], [place], [place] [address] Phone # [phone] Fax [phone]

## 2019-12-25 NOTE — Therapy (Signed)
[facility] [address] [place], [place], [address] Phone: [phone]   Fax:  [phone]  Physical Therapy Evaluation  Patient Details  Name: Audrey Baxter MRN: [id number] Date of Birth: [birth year] Referring Provider (PT): Cari Caraway, MD   Encounter Date: 12/25/2019  PT End of Session - 12/25/19 1525    Visit Number  1    Date for PT Re-Evaluation  02/19/20    Authorization Type  Medicare/AARP    PT Start Time  1447    PT Stop Time  1529    PT Time Calculation (min)  42 min    Activity Tolerance  Patient tolerated treatment well    Behavior During Therapy  [facility] for tasks assessed/performed       Past Medical History:  Diagnosis Date  . Anxiety   . Arthritis   . Cancer ([place])    cervical/melanoma  . Depression   . GERD (gastroesophageal reflux disease)   . Heart murmur   . History of colon polyps   . Hyperlipidemia   . Hypertension   . Insomnia   . Osteoporosis   . Radial fracture    Right  . Tremor     Past Surgical History:  Procedure Laterality Date  . BREAST CYST EXCISION Left   . BREAST SURGERY     Left lumpectomy  . BUNIONECTOMY Bilateral   . CARDIAC CATHETERIZATION     15 years ago  . OPEN REDUCTION INTERNAL FIXATION (ORIF) DISTAL RADIAL FRACTURE Right 08/19/2017   Procedure: OPEN REDUCTION INTERNAL FIXATION (ORIF) DISTAL RADIAL FRACTURE WITH REPAIR RECONSTRUCTION, ALLOGRAFT BONE GRAFT AS NECESSARY;  Surgeon: Roseanne Kaufman, MD;  Location: [place];  Service: Orthopedics;  Laterality: Right;  90 MINS  . WRIST ARTHROPLASTY  2007    There were no vitals filed for this visit.   Subjective Assessment - 12/25/19 1445    Subjective  Pt presents to PT with compliants of LBP x 3 months.  Pt was diagnosed with osteoporosis 1 week ago.    Pertinent History  osteoporosis, HTN,    Limitations  Walking    How long can you walk comfortably?  limited due to pain the next day (4 miles)    Diagnostic tests  none    Patient Stated Goals  reduce LBP, learn what I can do and shouldn't do with osteoporosis    Currently in Pain?  Yes    Pain Score  3    3-5/10   Pain Location  Back    Pain Orientation  Left;Right;Lower    Pain Descriptors / Indicators  Aching;Dull    Pain Type  Chronic pain    Pain Onset  More than a month ago    Pain Frequency  Intermittent    Aggravating Factors   walking too long (pain the next day), sitting too long with bad posture    Pain Relieving Factors  Advil, heat, Aspercream         [facility] PT Assessment - 12/25/19 0001      Assessment   Medical Diagnosis  osteoporosis of lumbar spine    Referring Provider (PT)  Cari Caraway, MD    Onset Date/Surgical Date  09/24/19    Next MD Visit  as needed    Prior Therapy  none      Precautions   Precautions  Other (comment)   osteoporosis     Restrictions   Weight Bearing Restrictions  No  Balance Screen   Has the patient fallen in the past 6 months  No    Has the patient had a decrease in activity level because of a fear of falling?   No    Is the patient reluctant to leave their home because of a fear of falling?   No      Home Environment   Living Environment  Private residence    Living Arrangements  Spouse/significant other    [place] to enter    Home Layout  Two level      Prior Function   Level of [place]  Retired    Leisure  walking for exercise, hiking      Cognition   Overall Cognitive Status  Within Functional Limits for tasks assessed      Observation/Other Assessments   Focus on Therapeutic Outcomes (FOTO)   39% limitation      Posture/Postural Control   Posture/Postural Control  Postural limitations    Postural Limitations  Decreased thoracic kyphosis;Forward head;Decreased lumbar lordosis      ROM / Strength   AROM / PROM / Strength  AROM;PROM;Strength      AROM   Overall AROM   Deficits    Overall AROM Comments   Lumbar flexion is full with reduced segmental mobility with majority of motion coming from hip flexion.  Lumbar/thoracic sidebending is limited by 25%      PROM   Overall PROM   Within functional limits for tasks performed      Strength   Overall Strength  Within functional limits for tasks performed    Overall Strength Comments  UE Strength 4+/5, LE 5/5 except hip flexors 4/5.        Palpation   Spinal mobility  reduce thoracic and lumbar spinal mobility without pain.      Palpation comment  tension in Lt rhomboids and inferior scapular border and bil lumbar paraspinals       Transfers   Transfers  Independent with all Transfers      Ambulation/Gait   Ambulation/Gait  Yes    Ambulation/Gait Assistance  7: Independent    Gait Pattern  Within Functional Limits    Ambulation Surface  Level                Objective measurements completed on examination: See above findings.              PT Education - 12/25/19 1524    Education Details  Access Code: [email or web address]   Osteoporosis information    Person(s) Educated  Patient    Methods  Explanation;Demonstration;Handout    Comprehension  Verbalized understanding;Returned demonstration       PT Short Term Goals - 12/25/19 1442      PT SHORT TERM GOAL #1   Title  be independent in initial HEP    Time  4    Period  Weeks    Status  New    Target Date  01/22/20      PT SHORT TERM GOAL #2   Title  report a 25% reductin in LBP with standing and walking    Time  4    Period  Weeks    Status  New    Target Date  01/22/20      PT SHORT TERM GOAL #3   Title  sleep with 25% fewer sleep interruptions due to LBP    Time  4  Period  Weeks    Target Date  01/22/20        PT Long Term Goals - 12/25/19 1544      PT LONG TERM GOAL #1   Title  be independent in advanced HEP    Time  8    Period  Weeks    Status  New    Target Date  02/19/20      PT LONG TERM GOAL #2   Title  reduce FOTO to < or = to 30%  limitation    Time  8    Period  Weeks    Status  New    Target Date  02/19/20      PT LONG TERM GOAL #3   Title  verbalize and demonstrate understanding of body mechanics and activity modifications for lumbar protection and to reduce risk of fracture    Time  8    Period  Weeks    Status  New    Target Date  02/19/20      PT LONG TERM GOAL #4   Title  sleep without limitation due to LBP    Time  8    Period  Weeks    Status  New    Target Date  02/19/20      PT LONG TERM GOAL #5   Title  walk for exercise without increased LBP the next day    Time  8    Period  Weeks    Status  New    Target Date  02/19/20             Plan - 12/25/19 1555    Clinical Impression Statement  Pt presents to PT with recent diagnosis of osteoporosis and 3 month history of LBP.  Pt would like to learn how to exercise as related to osteoporosis and how to reduce LBP.  Pt reports 3-5/10 LBP with daily activity, with sleep at night and after walking for exercise.  Pt with reduced thoracic kyphosis and reduced lumbar lordosis with forward head.  Pt with reduced spinal mobility and muscle tension over Lt thoracic musculature and bil lumbar musculature.  Pt will benefit from skilled PT to educate regarding osteoporosis, address LBP and increased core and postural strength.    Personal Factors and Comorbidities  Comorbidity 1    Comorbidities  osteoporosis    Examination-Activity Limitations  Locomotion Level;Sleep    Examination-Participation Restrictions  Community Activity    Stability/Clinical Decision Making  Stable/Uncomplicated    Clinical Decision Making  Low    Rehab Potential  Good    PT Frequency  2x / week    PT Duration  8 weeks    PT Treatment/Interventions  ADLs/Self Care Home Management;Cryotherapy;Electrical Stimulation;Moist Heat;Neuromuscular re-education;Therapeutic exercise;Therapeutic activities;Patient/family education;Manual techniques;Dry needling;Passive range of  motion;Taping    PT Next Visit Plan  review Osteoporosis dos and don'ts, review HEP, TA activation, postural strength    PT Home Exercise Plan  Access Code: [email or web address]    Consulted and Agree with Plan of Care  Patient       Patient will benefit from skilled therapeutic intervention in order to improve the following deficits and impairments:  Decreased activity tolerance, Decreased strength, Postural dysfunction, Improper body mechanics, Pain, Increased muscle spasms  Visit Diagnosis: Acute bilateral low back pain without sciatica - Plan: PT plan of care cert/re-cert  Abnormal posture - Plan: PT plan of care cert/re-cert  Muscle weakness (generalized) - Plan: PT plan of care  cert/re-cert     Problem List Patient Active Problem List   Diagnosis Date Noted  . Raynaud's disease 10/19/2018  . RLS (restless legs syndrome) 10/24/2017  . Pap smear for cervical cancer screening 09/15/2017  . Closed fracture of right distal radius 08/19/2017  . Physical exam 05/26/2017  . HTN (hypertension) 11/24/2016  . Hyperlipidemia 11/24/2016  . Major depression, recurrent ([place]) 11/24/2016  . Right knee pain 08/06/2013  . Hip pain, bilateral 11/16/2011  . Leg length inequality 11/16/2011    Audrey Baxter, PT 12/25/19 4:02 PM  [facility] [address] [place], [place], [address] Phone: [phone]   Fax:  [phone]  Name: Audrey Baxter MRN: [id number] Date of Birth: 1956/04/26

## 2019-12-27 ENCOUNTER — Ambulatory Visit: Payer: Medicare Other | Admitting: Physical Therapy

## 2019-12-27 ENCOUNTER — Other Ambulatory Visit: Payer: Self-pay

## 2019-12-27 DIAGNOSIS — M545 Low back pain, unspecified: Secondary | ICD-10-CM

## 2019-12-27 DIAGNOSIS — M6281 Muscle weakness (generalized): Secondary | ICD-10-CM

## 2019-12-27 DIAGNOSIS — R293 Abnormal posture: Secondary | ICD-10-CM

## 2019-12-27 NOTE — Therapy (Signed)
[facility] [address], [place], [place], [address] Phone: [phone]   Fax:  [phone]  Physical Therapy Treatment  Patient Details  Name: Audrey Baxter MRN: [id number] Date of Birth: 08-Nov-1956 Referring Provider (PT): Cari Caraway, MD   Encounter Date: 12/27/2019  PT End of Session - 12/27/19 0933    Visit Number  2    Date for PT Re-Evaluation  02/19/20    Authorization Type  Medicare/AARP    PT Start Time  0933    PT Stop Time  1015    PT Time Calculation (min)  42 min    Activity Tolerance  Patient tolerated treatment well    Behavior During Therapy  [facility] for tasks assessed/performed       Past Medical History:  Diagnosis Date  . Anxiety   . Arthritis   . Cancer ([place])    cervical/melanoma  . Depression   . GERD (gastroesophageal reflux disease)   . Heart murmur   . History of colon polyps   . Hyperlipidemia   . Hypertension   . Insomnia   . Osteoporosis   . Radial fracture    Right  . Tremor     Past Surgical History:  Procedure Laterality Date  . BREAST CYST EXCISION Left   . BREAST SURGERY     Left lumpectomy  . BUNIONECTOMY Bilateral   . CARDIAC CATHETERIZATION     15 years ago  . OPEN REDUCTION INTERNAL FIXATION (ORIF) DISTAL RADIAL FRACTURE Right 08/19/2017   Procedure: OPEN REDUCTION INTERNAL FIXATION (ORIF) DISTAL RADIAL FRACTURE WITH REPAIR RECONSTRUCTION, ALLOGRAFT BONE GRAFT AS NECESSARY;  Surgeon: Roseanne Kaufman, MD;  Location: [place];  Service: Orthopedics;  Laterality: Right;  90 MINS  . WRIST ARTHROPLASTY  2007    There were no vitals filed for this visit.  Subjective Assessment - 12/27/19 0933    Subjective  Did fine after eval.    Pertinent History  osteoporosis, HTN,    Currently in Pain?  Yes    Pain Score  4     Pain Location  Back    Pain Orientation  Right;Left;Lower    Pain Descriptors / Indicators  Aching    Multiple Pain Baxter  No                        [facility] Adult PT Treatment/Exercise - 12/27/19 0001      Self-Care   Self-Care  Posture    Posture  How posture relates to [employer] and don'ts of twisting when you have osteo, Yoga and osteo             PT Education - 12/27/19 0935    Education Details  HEP; Supine red band scap unattached series,educated pt in twisting dos & don't with Osteo, Level 1 TA series in supine,    Person(s) Educated  Patient    Methods  Explanation;Demonstration;Tactile cues;Verbal cues;Handout    Comprehension  Verbalized understanding;Returned demonstration       PT Short Term Goals - 12/25/19 1442      PT SHORT TERM GOAL #1   Title  be independent in initial HEP    Time  4    Period  Weeks    Status  New    Target Date  01/22/20      PT SHORT TERM GOAL #2   Title  report a 25% reductin in LBP with standing and walking  Time  4    Period  Weeks    Status  New    Target Date  01/22/20      PT SHORT TERM GOAL #3   Title  sleep with 25% fewer sleep interruptions due to LBP    Time  4    Period  Weeks    Target Date  01/22/20        PT Long Term Goals - 12/25/19 1544      PT LONG TERM GOAL #1   Title  be independent in advanced HEP    Time  8    Period  Weeks    Status  New    Target Date  02/19/20      PT LONG TERM GOAL #2   Title  reduce FOTO to < or = to 30% limitation    Time  8    Period  Weeks    Status  New    Target Date  02/19/20      PT LONG TERM GOAL #3   Title  verbalize and demonstrate understanding of body mechanics and activity modifications for lumbar protection and to reduce risk of fracture    Time  8    Period  Weeks    Status  New    Target Date  02/19/20      PT LONG TERM GOAL #4   Title  sleep without limitation due to LBP    Time  8    Period  Weeks    Status  New    Target Date  02/19/20      PT LONG TERM GOAL #5   Title  walk for exercise without increased LBP the next day    Time  8    Period   Weeks    Status  New    Target Date  02/19/20            Plan - 12/27/19 1002    Clinical Impression Statement  Pt was educated in posture and how it relates to Osteoporosis including the dos and don'ts of twisitng. Pt presents with no pain today. She is independent and compliant with initial HEP and was able to progress to include red band postural exercises as well as level 1 TA exercises in supine initially. Pt expressed a desire to reduce her frequency to 1x a week.    Personal Factors and Comorbidities  Comorbidity 1    Comorbidities  osteoporosis    Examination-Activity Limitations  Locomotion Level;Sleep    Examination-Participation Restrictions  Community Activity    Stability/Clinical Decision Making  Stable/Uncomplicated    Rehab Potential  Good    PT Frequency  2x / week    PT Duration  8 weeks    PT Treatment/Interventions  ADLs/Self Care Home Management;Cryotherapy;Electrical Stimulation;Moist Heat;Neuromuscular re-education;Therapeutic exercise;Therapeutic activities;Patient/family education;Manual techniques;Dry needling;Passive range of motion;Taping    PT Next Visit Plan  Review new HEP given today. Progress core exercises if pt is ready.    PT Home Exercise Plan  Access Code: [email or web address]    Consulted and Agree with Plan of Care  Patient       Patient will benefit from skilled therapeutic intervention in order to improve the following deficits and impairments:  Decreased activity tolerance, Decreased strength, Postural dysfunction, Improper body mechanics, Pain, Increased muscle spasms  Visit Diagnosis: Acute bilateral low back pain without sciatica  Abnormal posture  Muscle weakness (generalized)     Problem List Patient Active Problem  List   Diagnosis Date Noted  . Raynaud's disease 10/19/2018  . RLS (restless legs syndrome) 10/24/2017  . Pap smear for cervical cancer screening 09/15/2017  . Closed fracture of right distal radius 08/19/2017  . Physical  exam 05/26/2017  . HTN (hypertension) 11/24/2016  . Hyperlipidemia 11/24/2016  . Major depression, recurrent ([place]) 11/24/2016  . Right knee pain 08/06/2013  . Hip pain, bilateral 11/16/2011  . Leg length inequality 11/16/2011    Ezrael Sam, PTA 12/27/2019, 11:18 AM  [facility] [address], [place], [place], [address] Phone: [phone]   Fax:  [phone]  Name: Audrey Baxter MRN: [id number] Date of Birth: 1956/03/29 Access Code: [email or web address]  URL: [email or web address]  Date: 12/27/2019  Prepared by: Myrene Galas   Exercises  Seated Shoulder Abduction - Palms Down - 10 reps - 2 sets - 2x daily - 7x weekly  Seated Shoulder Flexion - 10 reps - 2 sets - 2x daily - 7x weekly  Seated Shoulder Scaption - 10 reps - 2 sets - 2x daily - 7x weekly  Seated Correct Posture - 10 reps - 3 sets - 1x daily - 7x weekly  Seated Hamstring Stretch - 10 reps - 3 sets - 1x daily - 7x weekly  Supine Transversus Abdominis Bracing - Hands on Stomach - 10 reps - 3 sets - 1x daily - 7x weekly  Seated Transversus Abdominis Bracing - 10 reps - 3 sets - 1x daily - 7x weekly  Supine Shoulder Horizontal Abduction with Resistance - 5 reps - 1 sets - 1x daily - 7x weekly  Seated Shoulder Diagonal with Resistance - 5 reps - 3 sets - 1x daily - 7x weekly

## 2019-12-27 NOTE — Patient Instructions (Signed)

## 2020-01-01 ENCOUNTER — Encounter: Payer: Self-pay | Admitting: Physical Therapy

## 2020-01-01 ENCOUNTER — Other Ambulatory Visit: Payer: Self-pay

## 2020-01-01 ENCOUNTER — Ambulatory Visit: Payer: Medicare Other | Admitting: Physical Therapy

## 2020-01-01 DIAGNOSIS — M545 Low back pain, unspecified: Secondary | ICD-10-CM

## 2020-01-01 DIAGNOSIS — M6281 Muscle weakness (generalized): Secondary | ICD-10-CM

## 2020-01-01 DIAGNOSIS — R293 Abnormal posture: Secondary | ICD-10-CM

## 2020-01-01 NOTE — Therapy (Signed)
[facility] [address] [place], [place], [address] Phone: [phone]   Fax:  [phone]  Physical Therapy Treatment  Patient Details  Name: Audrey Baxter MRN: [id number] Date of Birth: Jan 16, 1956 Referring Provider (PT): Cari Caraway, MD   Encounter Date: 01/01/2020  PT End of Session - 01/01/20 1105    Visit Number  3    Date for PT Re-Evaluation  02/19/20    Authorization Type  Medicare/AARP    PT Start Time  1105    PT Stop Time  1143    PT Time Calculation (min)  38 min    Activity Tolerance  Patient tolerated treatment well    Behavior During Therapy  [facility] for tasks assessed/performed       Past Medical History:  Diagnosis Date  . Anxiety   . Arthritis   . Cancer ([place])    cervical/melanoma  . Depression   . GERD (gastroesophageal reflux disease)   . Heart murmur   . History of colon polyps   . Hyperlipidemia   . Hypertension   . Insomnia   . Osteoporosis   . Radial fracture    Right  . Tremor     Past Surgical History:  Procedure Laterality Date  . BREAST CYST EXCISION Left   . BREAST SURGERY     Left lumpectomy  . BUNIONECTOMY Bilateral   . CARDIAC CATHETERIZATION     15 years ago  . OPEN REDUCTION INTERNAL FIXATION (ORIF) DISTAL RADIAL FRACTURE Right 08/19/2017   Procedure: OPEN REDUCTION INTERNAL FIXATION (ORIF) DISTAL RADIAL FRACTURE WITH REPAIR RECONSTRUCTION, ALLOGRAFT BONE GRAFT AS NECESSARY;  Surgeon: Roseanne Kaufman, MD;  Location: [place] [facility];  Service: Orthopedics;  Laterality: Right;  90 MINS  . WRIST ARTHROPLASTY  2007    There were no vitals filed for this visit.  Subjective Assessment - 01/01/20 1105    Subjective  My back continues to hurt.    Pertinent History  osteoporosis, HTN,    Currently in Pain?  Yes    Pain Score  5     Pain Location  Back    Pain Orientation  Right;Left;Lower    Pain Descriptors / Indicators  Sore    Aggravating Factors   walking too long     Pain Relieving Factors  heat, advil    Multiple Pain Sites  No                       [facility] Adult PT Treatment/Exercise - 01/01/20 0001      Lumbar Exercises: Stretches   Active Hamstring Stretch  Right;Left;3 reps;30 seconds    Active Hamstring Stretch Limitations  used yoga strap    Single Knee to Chest Stretch  Right;Left;3 reps;10 seconds    Single Knee to Chest Stretch Limitations  VC to keep back flat.     Figure 4 Stretch  3 reps;10 seconds;With overpressure      Lumbar Exercises: Supine   Ab Set  10 reps;3 seconds    AB Set Limitations  Vc to not pelvic tilt    Clam  20 reps    Clam Limitations  added red band and assigned for home    Bent Knee Raise  20 reps             PT Education - 01/01/20 1113    Education Details  Pt will try stretching after her walk to see if that helps with her LBP after  the long walks.    Person(s) Educated  Patient    Methods  Explanation;Demonstration;Verbal cues;Handout    Comprehension  Returned demonstration;Verbalized understanding       PT Short Term Goals - 12/25/19 1442      PT SHORT TERM GOAL #1   Title  be independent in initial HEP    Time  4    Period  Weeks    Status  New    Target Date  01/22/20      PT SHORT TERM GOAL #2   Title  report a 25% reductin in LBP with standing and walking    Time  4    Period  Weeks    Status  New    Target Date  01/22/20      PT SHORT TERM GOAL #3   Title  sleep with 25% fewer sleep interruptions due to LBP    Time  4    Period  Weeks    Target Date  01/22/20        PT Long Term Goals - 12/25/19 1544      PT LONG TERM GOAL #1   Title  be independent in advanced HEP    Time  8    Period  Weeks    Status  New    Target Date  02/19/20      PT LONG TERM GOAL #2   Title  reduce FOTO to < or = to 30% limitation    Time  8    Period  Weeks    Status  New    Target Date  02/19/20      PT LONG TERM GOAL #3   Title  verbalize and demonstrate  understanding of body mechanics and activity modifications for lumbar protection and to reduce risk of fracture    Time  8    Period  Weeks    Status  New    Target Date  02/19/20      PT LONG TERM GOAL #4   Title  sleep without limitation due to LBP    Time  8    Period  Weeks    Status  New    Target Date  02/19/20      PT LONG TERM GOAL #5   Title  walk for exercise without increased LBP the next day    Time  8    Period  Weeks    Status  New    Target Date  02/19/20            Plan - 01/01/20 1116    Clinical Impression Statement  Pt arrives with no questions regarding osteoporosis managment. She would like to focus on her LBP today, especially having pain after her long walks ( 5 miles ). Pt will try strteching after she walks to see if that can help her pain.  She has returned to theraputic yoga this week. Pt's RT hip was most tight ( piriformis) .    Personal Factors and Comorbidities  Comorbidity 1    Comorbidities  osteoporosis    Examination-Activity Limitations  Locomotion Level;Sleep    Examination-Participation Restrictions  Community Activity    Stability/Clinical Decision Making  Stable/Uncomplicated    Rehab Potential  Good    PT Frequency  2x / week    PT Duration  8 weeks    PT Treatment/Interventions  ADLs/Self Care Home Management;Cryotherapy;Electrical Stimulation;Moist Heat;Neuromuscular re-education;Therapeutic exercise;Therapeutic activities;Patient/family education;Manual techniques;Dry needling;Passive range of motion;Taping  PT Next Visit Plan  See how streching post walking is going. Continue core strength that is Osteo safe.    PT Home Exercise Plan  Access Code: [email or web address]    Consulted and Agree with Plan of Care  Patient       Patient will benefit from skilled therapeutic intervention in order to improve the following deficits and impairments:  Decreased activity tolerance, Decreased strength, Postural dysfunction, Improper body mechanics,  Pain, Increased muscle spasms  Visit Diagnosis: Acute bilateral low back pain without sciatica  Abnormal posture  Muscle weakness (generalized)     Problem List Patient Active Problem List   Diagnosis Date Noted  . Raynaud's disease 10/19/2018  . RLS (restless legs syndrome) 10/24/2017  . Pap smear for cervical cancer screening 09/15/2017  . Closed fracture of right distal radius 08/19/2017  . Physical exam 05/26/2017  . HTN (hypertension) 11/24/2016  . Hyperlipidemia 11/24/2016  . Major depression, recurrent ([place]) 11/24/2016  . Right knee pain 08/06/2013  . Hip pain, bilateral 11/16/2011  . Leg length inequality 11/16/2011    Kahlea Cobert, PTA 01/01/2020, 11:47 AM  [facility] [address], [place], [place], [address] Phone: [phone]   Fax:  [phone]  Name: Hannia, Holen MRN: [id number] Date of Birth: 03/15/1956  Access Code: [email or web address]  URL: [email or web address]  Date: 01/01/2020  Prepared by: Myrene Galas   Exercises  Seated Shoulder Abduction - Palms Down - 10 reps - 2 sets - 2x daily - 7x weekly  Seated Shoulder Flexion - 10 reps - 2 sets - 2x daily - 7x weekly  Seated Shoulder Scaption - 10 reps - 2 sets - 2x daily - 7x weekly  Seated Correct Posture - 10 reps - 3 sets - 1x daily - 7x weekly  Seated Hamstring Stretch - 10 reps - 3 sets - 1x daily - 7x weekly  Supine Transversus Abdominis Bracing - Hands on Stomach - 10 reps - 3 sets - 1x daily - 7x weekly  Seated Transversus Abdominis Bracing - 10 reps - 3 sets - 1x daily - 7x weekly  Supine Shoulder Horizontal Abduction with Resistance - 5 reps - 1 sets - 1x daily - 7x weekly  Seated Shoulder Diagonal with Resistance - 5 reps - 3 sets - 1x daily - 7x weekly  Supine Hamstring Stretch with Strap - 3 reps - 30 hold - 1x daily - 7x weekly  Hooklying Single Knee to Chest Stretch - 2 reps - 30 hold - 1x daily - 7x  weekly  Supine Figure 4 Piriformis Stretch - 2 reps - 30 hold - 1x daily - 7x weekly  Hooklying Clamshell with Resistance - 10 reps - 2 sets - 1x daily - 7x weekly

## 2020-01-06 ENCOUNTER — Encounter: Payer: Medicare Other | Admitting: Physical Therapy

## 2020-01-08 ENCOUNTER — Encounter: Payer: Medicare Other | Admitting: Physical Therapy

## 2020-01-08 ENCOUNTER — Ambulatory Visit: Payer: Medicare Other | Attending: Internal Medicine

## 2020-01-08 DIAGNOSIS — Z23 Encounter for immunization: Secondary | ICD-10-CM | POA: Insufficient documentation

## 2020-01-08 NOTE — Progress Notes (Signed)
   Covid-19 Vaccination Clinic  Name:  Audrey Baxter    MRN: [id number] DOB: Apr 16, 1956  01/08/2020  [name] was observed post Covid-19 immunization for 15 minutes without incidence. She was provided with Vaccine Information Sheet and instruction to access the V-Safe system.   [name] was instructed to call 911 with any severe reactions post vaccine: Marland Kitchen Difficulty breathing  . Swelling of your face and throat  . A fast heartbeat  . A bad rash all over your body  . Dizziness and weakness    Immunizations Administered    Name Date Dose VIS Date Route   Pfizer COVID-19 Vaccine 01/08/2020  9:30 AM 0.3 mL 11/29/2019 Intramuscular   Manufacturer: [place]   Lot: [id number]   [place]: [id number]

## 2020-01-10 ENCOUNTER — Ambulatory Visit: Payer: Medicare Other | Admitting: Physical Therapy

## 2020-01-10 ENCOUNTER — Other Ambulatory Visit: Payer: Self-pay

## 2020-01-10 ENCOUNTER — Encounter: Payer: Self-pay | Admitting: Physical Therapy

## 2020-01-10 DIAGNOSIS — R293 Abnormal posture: Secondary | ICD-10-CM

## 2020-01-10 DIAGNOSIS — M545 Low back pain, unspecified: Secondary | ICD-10-CM

## 2020-01-10 DIAGNOSIS — M6281 Muscle weakness (generalized): Secondary | ICD-10-CM

## 2020-01-10 NOTE — Therapy (Signed)
[facility] [address], [place], [place], [address] Phone: [phone]   Fax:  [phone]  Physical Therapy Treatment  Patient Details  Name: Audrey Baxter MRN: [id number] Date of Birth: Oct 17, 1956 Referring Provider (PT): Cari Caraway, MD   Encounter Date: 01/10/2020  PT End of Session - 01/10/20 0813    Visit Number  4    Date for PT Re-Evaluation  02/19/20    Authorization Type  Medicare/AARP    PT Start Time  0807    PT Stop Time  0848    PT Time Calculation (min)  41 min    Activity Tolerance  Patient tolerated treatment well    Behavior During Therapy  [facility] for tasks assessed/performed       Past Medical History:  Diagnosis Date  . Anxiety   . Arthritis   . Cancer ([place])    cervical/melanoma  . Depression   . GERD (gastroesophageal reflux disease)   . Heart murmur   . History of colon polyps   . Hyperlipidemia   . Hypertension   . Insomnia   . Osteoporosis   . Radial fracture    Right  . Tremor     Past Surgical History:  Procedure Laterality Date  . BREAST CYST EXCISION Left   . BREAST SURGERY     Left lumpectomy  . BUNIONECTOMY Bilateral   . CARDIAC CATHETERIZATION     15 years ago  . OPEN REDUCTION INTERNAL FIXATION (ORIF) DISTAL RADIAL FRACTURE Right 08/19/2017   Procedure: OPEN REDUCTION INTERNAL FIXATION (ORIF) DISTAL RADIAL FRACTURE WITH REPAIR RECONSTRUCTION, ALLOGRAFT BONE GRAFT AS NECESSARY;  Surgeon: Roseanne Kaufman, MD;  Location: [place];  Service: Orthopedics;  Laterality: Right;  90 MINS  . WRIST ARTHROPLASTY  2007    There were no vitals filed for this visit.  Subjective Assessment - 01/10/20 0815    Subjective  The stretches have been great for my back especially after my walks. I have had a good week.    Pertinent History  osteoporosis, HTN,    Currently in Pain?  Yes    Pain Score  1     Pain Location  Back    Pain Orientation  Lower    Pain Descriptors /  Indicators  Dull    Multiple Pain Sites  No                       [facility] Adult PT Treatment/Exercise - 01/10/20 0001      Lumbar Exercises: Stretches   Active Hamstring Stretch  Right;Left;3 reps;30 seconds    Active Hamstring Stretch Limitations  used yoga strap    Single Knee to Chest Stretch  Right;Left;3 reps;10 seconds    Single Knee to Chest Stretch Limitations  VC to keep back flat.     Figure 4 Stretch  3 reps;20 seconds;With overpressure      Lumbar Exercises: Supine   Clam  20 reps    Clam Limitations  added green band and assigned for home    Bridge  5 reps;3 seconds    Bridge Limitations  Flat back bridge     Other Supine Lumbar Exercises  Green band supine horizontal abd 10x, issued green band for HEP progression             PT Education - 01/10/20 0849    Education Details  Flat back bridge given for HEP progression,    Person(s) Educated  Patient  Methods  Explanation;Demonstration;Verbal cues;Handout    Comprehension  Returned demonstration;Verbalized understanding       PT Short Term Goals - 01/10/20 0836      PT SHORT TERM GOAL #1   Title  be independent in initial HEP    Time  4    Period  Weeks    Status  Achieved    Target Date  01/22/20      PT SHORT TERM GOAL #2   Title  report a 25% reductin in LBP with standing and walking    Time  4    Period  Weeks    Status  On-going   40%   Target Date  01/22/20      PT SHORT TERM GOAL #3   Title  sleep with 25% fewer sleep interruptions due to LBP    Time  4    Period  Weeks    Status  Achieved   90%   Target Date  01/22/20        PT Long Term Goals - 12/25/19 1544      PT LONG TERM GOAL #1   Title  be independent in advanced HEP    Time  8    Period  Weeks    Status  New    Target Date  02/19/20      PT LONG TERM GOAL #2   Title  reduce FOTO to < or = to 30% limitation    Time  8    Period  Weeks    Status  New    Target Date  02/19/20      PT LONG TERM  GOAL #3   Title  verbalize and demonstrate understanding of body mechanics and activity modifications for lumbar protection and to reduce risk of fracture    Time  8    Period  Weeks    Status  New    Target Date  02/19/20      PT LONG TERM GOAL #4   Title  sleep without limitation due to LBP    Time  8    Period  Weeks    Status  New    Target Date  02/19/20      PT LONG TERM GOAL #5   Title  walk for exercise without increased LBP the next day    Time  8    Period  Weeks    Status  New    Target Date  02/19/20            Plan - 01/10/20 [id number]    Personal Factors and Comorbidities  Comorbidity 1    Comorbidities  osteoporosis    Examination-Activity Limitations  Locomotion Level;Sleep    Examination-Participation Restrictions  Community Activity    Stability/Clinical Decision Making  Stable/Uncomplicated    Rehab Potential  Good    PT Frequency  2x / week    PT Treatment/Interventions  ADLs/Self Care Home Management;Cryotherapy;Electrical Stimulation;Moist Heat;Neuromuscular re-education;Therapeutic exercise;Therapeutic activities;Patient/family education;Manual techniques;Dry needling;Passive range of motion;Taping    PT Home Exercise Plan  Access Code: [email or web address]    Consulted and Agree with Plan of Care  Patient       Patient will benefit from skilled therapeutic intervention in order to improve the following deficits and impairments:  Decreased activity tolerance, Decreased strength, Postural dysfunction, Improper body mechanics, Pain, Increased muscle spasms  Visit Diagnosis: Acute bilateral low back pain without sciatica  Abnormal posture  Muscle weakness (generalized)  Problem List Patient Active Problem List   Diagnosis Date Noted  . Raynaud's disease 10/19/2018  . RLS (restless legs syndrome) 10/24/2017  . Pap smear for cervical cancer screening 09/15/2017  . Closed fracture of right distal radius 08/19/2017  . Physical exam 05/26/2017  . HTN  (hypertension) 11/24/2016  . Hyperlipidemia 11/24/2016  . Major depression, recurrent ([place]) 11/24/2016  . Right knee pain 08/06/2013  . Hip pain, bilateral 11/16/2011  . Leg length inequality 11/16/2011    Naithan Delage, PTA 01/10/2020, 8:54 AM  [facility] [address], [place], [place], [address] Phone: [phone]   Fax:  [phone]  Name: AVENLEY RAJPUT MRN: [id number] Date of Birth: January 03, 1956 Access Code: [email or web address]  URL: [email or web address]  Date: 01/10/2020  Prepared by: Myrene Galas   Exercises  Seated Shoulder Abduction - Palms Down - 10 reps - 2 sets - 2x daily - 7x weekly  Seated Shoulder Flexion - 10 reps - 2 sets - 2x daily - 7x weekly  Seated Shoulder Scaption - 10 reps - 2 sets - 2x daily - 7x weekly  Seated Correct Posture - 10 reps - 3 sets - 1x daily - 7x weekly  Seated Hamstring Stretch - 10 reps - 3 sets - 1x daily - 7x weekly  Supine Transversus Abdominis Bracing - Hands on Stomach - 10 reps - 3 sets - 1x daily - 7x weekly  Seated Transversus Abdominis Bracing - 10 reps - 3 sets - 1x daily - 7x weekly  Supine Shoulder Horizontal Abduction with Resistance - 5 reps - 1 sets - 1x daily - 7x weekly  Seated Shoulder Diagonal with Resistance - 5 reps - 3 sets - 1x daily - 7x weekly  Supine Hamstring Stretch with Strap - 3 reps - 30 hold - 1x daily - 7x weekly  Hooklying Single Knee to Chest Stretch - 2 reps - 30 hold - 1x daily - 7x weekly  Supine Figure 4 Piriformis Stretch - 2 reps - 30 hold - 1x daily - 7x weekly  Hooklying Clamshell with Resistance - 10 reps - 2 sets - 1x daily - 7x weekly  Supine Bridge - 10 reps - 1 sets - 3 hold - 1x daily - 7x weekly

## 2020-01-15 ENCOUNTER — Encounter: Payer: Medicare Other | Admitting: Physical Therapy

## 2020-01-17 ENCOUNTER — Other Ambulatory Visit: Payer: Self-pay

## 2020-01-17 ENCOUNTER — Encounter: Payer: Self-pay | Admitting: Physical Therapy

## 2020-01-17 ENCOUNTER — Ambulatory Visit: Payer: Medicare Other | Admitting: Physical Therapy

## 2020-01-17 DIAGNOSIS — M545 Low back pain, unspecified: Secondary | ICD-10-CM

## 2020-01-17 DIAGNOSIS — M6281 Muscle weakness (generalized): Secondary | ICD-10-CM

## 2020-01-17 DIAGNOSIS — R293 Abnormal posture: Secondary | ICD-10-CM

## 2020-01-17 NOTE — Therapy (Signed)
[facility] [address] [place], [place], [address] Phone: [phone]   Fax:  [phone]  Physical Therapy Treatment  Patient Details  Name: Audrey Baxter MRN: [id number] Date of Birth: 1956-11-13 Referring Provider (PT): Cari Caraway, MD   Encounter Date: 01/17/2020  PT End of Session - 01/17/20 0803    Visit Number  5    Date for PT Re-Evaluation  02/19/20    Authorization Type  Medicare/AARP    PT Start Time  0802    PT Stop Time  0842    PT Time Calculation (min)  40 min    Activity Tolerance  Patient tolerated treatment well    Behavior During Therapy  [facility] for tasks assessed/performed       Past Medical History:  Diagnosis Date  . Anxiety   . Arthritis   . Cancer ([place])    cervical/melanoma  . Depression   . GERD (gastroesophageal reflux disease)   . Heart murmur   . History of colon polyps   . Hyperlipidemia   . Hypertension   . Insomnia   . Osteoporosis   . Radial fracture    Right  . Tremor     Past Surgical History:  Procedure Laterality Date  . BREAST CYST EXCISION Left   . BREAST SURGERY     Left lumpectomy  . BUNIONECTOMY Bilateral   . CARDIAC CATHETERIZATION     15 years ago  . OPEN REDUCTION INTERNAL FIXATION (ORIF) DISTAL RADIAL FRACTURE Right 08/19/2017   Procedure: OPEN REDUCTION INTERNAL FIXATION (ORIF) DISTAL RADIAL FRACTURE WITH REPAIR RECONSTRUCTION, ALLOGRAFT BONE GRAFT AS NECESSARY;  Surgeon: Roseanne Kaufman, MD;  Location: [place];  Service: Orthopedics;  Laterality: Right;  90 MINS  . WRIST ARTHROPLASTY  2007    There were no vitals filed for this visit.  Subjective Assessment - 01/17/20 0802    Subjective  I am feeling so much better. Pt reports 70% improvement.    Pertinent History  osteoporosis, HTN,    Currently in Pain?  Yes    Pain Score  1     Pain Location  Back    Pain Orientation  Lower    Pain Descriptors / Indicators  Dull    Multiple Pain Sites   No                       [facility] Adult PT Treatment/Exercise - 01/17/20 0001      Lumbar Exercises: Standing   Other Standing Lumbar Exercises  Single leg stance contralateral side with shoulder 3 way raise 3#    pt ordered 3# for home     Lumbar Exercises: Supine   Bridge  10 reps;3 seconds    Bridge Limitations  Flat back bridge     Bridge with clamshell  --   2x5 VC to open hips smaller ROM     Lumbar Exercises: Sidelying   Clam  Both;20 reps    Clam Limitations  second set added red band: added for HEP             PT Education - 01/17/20 0816    Education Details  HEP: red band sidelying clamshells issued band for home    Person(s) Educated  Patient    Methods  Explanation;Demonstration;Tactile cues;Verbal cues;Handout    Comprehension  Returned demonstration;Verbalized understanding       PT Short Term Goals - 01/10/20 0836      PT SHORT  TERM GOAL #1   Title  be independent in initial HEP    Time  4    Period  Weeks    Status  Achieved    Target Date  01/22/20      PT SHORT TERM GOAL #2   Title  report a 25% reductin in LBP with standing and walking    Time  4    Period  Weeks    Status  On-going   40%   Target Date  01/22/20      PT SHORT TERM GOAL #3   Title  sleep with 25% fewer sleep interruptions due to LBP    Time  4    Period  Weeks    Status  Achieved   90%   Target Date  01/22/20        PT Long Term Goals - 12/25/19 1544      PT LONG TERM GOAL #1   Title  be independent in advanced HEP    Time  8    Period  Weeks    Status  New    Target Date  02/19/20      PT LONG TERM GOAL #2   Title  reduce FOTO to < or = to 30% limitation    Time  8    Period  Weeks    Status  New    Target Date  02/19/20      PT LONG TERM GOAL #3   Title  verbalize and demonstrate understanding of body mechanics and activity modifications for lumbar protection and to reduce risk of fracture    Time  8    Period  Weeks    Status  New     Target Date  02/19/20      PT LONG TERM GOAL #4   Title  sleep without limitation due to LBP    Time  8    Period  Weeks    Status  New    Target Date  02/19/20      PT LONG TERM GOAL #5   Title  walk for exercise without increased LBP the next day    Time  8    Period  Weeks    Status  New    Target Date  02/19/20            Plan - 01/17/20 0818    Clinical Impression Statement  Pt reports overall 75% improvement since evaluation. Pt will add resisted red band sidelying clamshells and 3# to her shoulder 3 way rasie for progression of HEP. No pain during session.    Personal Factors and Comorbidities  Comorbidity 1    Comorbidities  osteoporosis    Examination-Activity Limitations  Locomotion Level;Sleep    Examination-Participation Restrictions  Community Activity    Stability/Clinical Decision Making  Stable/Uncomplicated    Rehab Potential  Good    PT Frequency  2x / week    PT Duration  8 weeks    PT Treatment/Interventions  ADLs/Self Care Home Management;Cryotherapy;Electrical Stimulation;Moist Heat;Neuromuscular re-education;Therapeutic exercise;Therapeutic activities;Patient/family education;Manual techniques;Dry needling;Passive range of motion;Taping    PT Next Visit Plan  Review new HEP, andd clamshells to bridge to HEP    PT Home Exercise Plan  Access Code: [email or web address]    Consulted and Agree with Plan of Care  Patient       Patient will benefit from skilled therapeutic intervention in order to improve the following deficits and impairments:  Decreased activity tolerance,  Decreased strength, Postural dysfunction, Improper body mechanics, Pain, Increased muscle spasms  Visit Diagnosis: Acute bilateral low back pain without sciatica  Abnormal posture  Muscle weakness (generalized)     Problem List Patient Active Problem List   Diagnosis Date Noted  . Raynaud's disease 10/19/2018  . RLS (restless legs syndrome) 10/24/2017  . Pap smear for cervical  cancer screening 09/15/2017  . Closed fracture of right distal radius 08/19/2017  . Physical exam 05/26/2017  . HTN (hypertension) 11/24/2016  . Hyperlipidemia 11/24/2016  . Major depression, recurrent ([place]) 11/24/2016  . Right knee pain 08/06/2013  . Hip pain, bilateral 11/16/2011  . Leg length inequality 11/16/2011    Jaysen Wey, PTA 01/17/2020, 8:43 AM  [facility] [address] [place], [place], [address] Phone: [phone]   Fax:  [phone]  Name: Audrey Baxter MRN: [id number] Date of Birth: [birth year]

## 2020-01-22 ENCOUNTER — Encounter: Payer: Medicare Other | Admitting: Physical Therapy

## 2020-01-24 ENCOUNTER — Encounter: Payer: Self-pay | Admitting: Physical Therapy

## 2020-01-24 ENCOUNTER — Other Ambulatory Visit: Payer: Self-pay

## 2020-01-24 ENCOUNTER — Ambulatory Visit: Payer: Medicare Other | Attending: Family Medicine | Admitting: Physical Therapy

## 2020-01-24 DIAGNOSIS — M6281 Muscle weakness (generalized): Secondary | ICD-10-CM

## 2020-01-24 DIAGNOSIS — R293 Abnormal posture: Secondary | ICD-10-CM

## 2020-01-24 DIAGNOSIS — M545 Low back pain, unspecified: Secondary | ICD-10-CM

## 2020-01-24 NOTE — Therapy (Signed)
[facility] [address] [place], [place], [address] Phone: [phone]   Fax:  [phone]  Physical Therapy Treatment  Patient Details  Name: Audrey Baxter MRN: [id number] Date of Birth: 1956/05/27 Referring Provider (PT): Cari Caraway, MD   Encounter Date: 01/24/2020  PT End of Session - 01/24/20 0930    Visit Number  6    Date for PT Re-Evaluation  02/19/20    PT Start Time  0930    PT Stop Time  1010    PT Time Calculation (min)  40 min    Activity Tolerance  Patient tolerated treatment well    Behavior During Therapy  [facility] for tasks assessed/performed       Past Medical History:  Diagnosis Date  . Anxiety   . Arthritis   . Cancer ([place])    cervical/melanoma  . Depression   . GERD (gastroesophageal reflux disease)   . Heart murmur   . History of colon polyps   . Hyperlipidemia   . Hypertension   . Insomnia   . Osteoporosis   . Radial fracture    Right  . Tremor     Past Surgical History:  Procedure Laterality Date  . BREAST CYST EXCISION Left   . BREAST SURGERY     Left lumpectomy  . BUNIONECTOMY Bilateral   . CARDIAC CATHETERIZATION     15 years ago  . OPEN REDUCTION INTERNAL FIXATION (ORIF) DISTAL RADIAL FRACTURE Right 08/19/2017   Procedure: OPEN REDUCTION INTERNAL FIXATION (ORIF) DISTAL RADIAL FRACTURE WITH REPAIR RECONSTRUCTION, ALLOGRAFT BONE GRAFT AS NECESSARY;  Surgeon: Roseanne Kaufman, MD;  Location: [place];  Service: Orthopedics;  Laterality: Right;  90 MINS  . WRIST ARTHROPLASTY  2007    There were no vitals filed for this visit.  Subjective Assessment - 01/24/20 0934    Subjective  I am working on my new exercises. I have a few questions.    Pertinent History  osteoporosis, HTN,    Currently in Pain?  Yes    Pain Score  1     Pain Location  Back    Pain Orientation  Lower    Pain Descriptors / Indicators  Dull    Multiple Pain Sites  No                        [facility] Adult PT Treatment/Exercise - 01/24/20 0001      Lumbar Exercises: Stretches   Figure 4 Stretch  3 reps;20 seconds;With overpressure      Lumbar Exercises: Standing   Other Standing Lumbar Exercises  Single leg stance contralateral side with shoulder 3 way raise 3#    pt ordered 3# for home     Lumbar Exercises: Seated   Other Seated Lumbar Exercises  green band horizontal abd 10x seated. Pt will now perform in seated position.       Lumbar Exercises: Sidelying   Clam  Both;20 reps   2x20 bil supine   Clam Limitations  red band      Lumbar Exercises: Prone   Other Prone Lumbar Exercises  Forearm plank 2x 10 sec added to HEP               PT Short Term Goals - 01/10/20 0836      PT SHORT TERM GOAL #1   Title  be independent in initial HEP    Time  4    Period  Weeks  Status  Achieved    Target Date  01/22/20      PT SHORT TERM GOAL #2   Title  report a 25% reductin in LBP with standing and walking    Time  4    Period  Weeks    Status  On-going   40%   Target Date  01/22/20      PT SHORT TERM GOAL #3   Title  sleep with 25% fewer sleep interruptions due to LBP    Time  4    Period  Weeks    Status  Achieved   90%   Target Date  01/22/20        PT Long Term Goals - 12/25/19 1544      PT LONG TERM GOAL #1   Title  be independent in advanced HEP    Time  8    Period  Weeks    Status  New    Target Date  02/19/20      PT LONG TERM GOAL #2   Title  reduce FOTO to < or = to 30% limitation    Time  8    Period  Weeks    Status  New    Target Date  02/19/20      PT LONG TERM GOAL #3   Title  verbalize and demonstrate understanding of body mechanics and activity modifications for lumbar protection and to reduce risk of fracture    Time  8    Period  Weeks    Status  New    Target Date  02/19/20      PT LONG TERM GOAL #4   Title  sleep without limitation due to LBP    Time  8    Period  Weeks     Status  New    Target Date  02/19/20      PT LONG TERM GOAL #5   Title  walk for exercise without increased LBP the next day    Time  8    Period  Weeks    Status  New    Target Date  02/19/20            Plan - 01/24/20 0932    Clinical Impression Statement  Pt arrives with very mininmal pain and reports she conitnues to do well with her overall program ( including walking). We covered a few questions regading her new HEP from last session in addition to adding a prone plank 3 days a week. pt is compliant with her HEP.    Personal Factors and Comorbidities  Comorbidity 1    Comorbidities  osteoporosis    Examination-Activity Limitations  Locomotion Level;Sleep    Examination-Participation Restrictions  Community Activity    Stability/Clinical Decision Making  Stable/Uncomplicated    Rehab Potential  Good    PT Frequency  2x / week    PT Duration  8 weeks    PT Treatment/Interventions  ADLs/Self Care Home Management;Cryotherapy;Electrical Stimulation;Moist Heat;Neuromuscular re-education;Therapeutic exercise;Therapeutic activities;Patient/family education;Manual techniques;Dry needling;Passive range of motion;Taping    PT Next Visit Plan  DC per POC next visit.    PT Home Exercise Plan  Access Code: [email or web address]    Consulted and Agree with Plan of Care  Patient       Patient will benefit from skilled therapeutic intervention in order to improve the following deficits and impairments:  Decreased activity tolerance, Decreased strength, Postural dysfunction, Improper body mechanics, Pain, Increased muscle spasms  Visit  Diagnosis: Acute bilateral low back pain without sciatica  Abnormal posture  Muscle weakness (generalized)     Problem List Patient Active Problem List   Diagnosis Date Noted  . Raynaud's disease 10/19/2018  . RLS (restless legs syndrome) 10/24/2017  . Pap smear for cervical cancer screening 09/15/2017  . Closed fracture of right distal radius 08/19/2017  .  Physical exam 05/26/2017  . HTN (hypertension) 11/24/2016  . Hyperlipidemia 11/24/2016  . Major depression, recurrent ([place]) 11/24/2016  . Right knee pain 08/06/2013  . Hip pain, bilateral 11/16/2011  . Leg length inequality 11/16/2011    Genessa Beman, PTA 01/24/2020, 10:18 AM  [facility] [address], [place], [place], [address] Phone: [phone]   Fax:  [phone]  Name: Audrey Baxter MRN: [id number] Date of Birth: Feb 02, 1956  Access Code: [email or web address]  URL: [email or web address]  Date: 01/24/2020  Prepared by: Myrene Galas   Exercises  Seated Shoulder Abduction - Palms Down - 10 reps - 2 sets - 2x daily - 7x weekly  Seated Shoulder Flexion - 10 reps - 2 sets - 2x daily - 7x weekly  Seated Shoulder Scaption - 10 reps - 2 sets - 2x daily - 7x weekly  Seated Correct Posture - 10 reps - 3 sets - 1x daily - 7x weekly  Seated Hamstring Stretch - 10 reps - 3 sets - 1x daily - 7x weekly  Supine Transversus Abdominis Bracing - Hands on Stomach - 10 reps - 3 sets - 1x daily - 7x weekly  Seated Transversus Abdominis Bracing - 10 reps - 3 sets - 1x daily - 7x weekly  Supine Shoulder Horizontal Abduction with Resistance - 5 reps - 1 sets - 1x daily - 7x weekly  Seated Shoulder Diagonal with Resistance - 5 reps - 3 sets - 1x daily - 7x weekly  Supine Hamstring Stretch with Strap - 3 reps - 30 hold - 1x daily - 7x weekly  Hooklying Single Knee to Chest Stretch - 2 reps - 30 hold - 1x daily - 7x weekly  Supine Figure 4 Piriformis Stretch - 2 reps - 30 hold - 1x daily - 7x weekly  Hooklying Clamshell with Resistance - 10 reps - 2 sets - 1x daily - 7x weekly  Supine Bridge - 10 reps - 1 sets - 3 hold - 1x daily - 7x weekly  Clamshell with Resistance - 10 reps - 2 sets - 1 hold - 1x daily - 7x weekly  Plank on Forearms with Scapular Protraction Retraction AROM - 3 reps - 10 hold - 1x daily - 3x weekly

## 2020-01-26 ENCOUNTER — Ambulatory Visit: Payer: Medicare Other | Attending: Internal Medicine

## 2020-01-26 DIAGNOSIS — Z23 Encounter for immunization: Secondary | ICD-10-CM

## 2020-01-26 NOTE — Progress Notes (Signed)
   Covid-19 Vaccination Clinic  Name:  Audrey Baxter    MRN: [id number] DOB: 1956/02/02  01/26/2020  [name] was observed post Covid-19 immunization for 15 minutes without incidence. She was provided with Vaccine Information Sheet and instruction to access the V-Safe system.   [name] was instructed to call 911 with any severe reactions post vaccine: Marland Kitchen Difficulty breathing  . Swelling of your face and throat  . A fast heartbeat  . A bad rash all over your body  . Dizziness and weakness    Immunizations Administered    Name Date Dose VIS Date Route   Pfizer COVID-19 Vaccine 01/26/2020 12:29 PM 0.3 mL 11/29/2019 Intramuscular   Manufacturer: [place]   Lot: [id number]   [place]: [id number]

## 2020-01-29 ENCOUNTER — Encounter: Payer: Self-pay | Admitting: Physical Therapy

## 2020-01-29 ENCOUNTER — Other Ambulatory Visit: Payer: Self-pay

## 2020-01-29 ENCOUNTER — Ambulatory Visit: Payer: Medicare Other | Admitting: Physical Therapy

## 2020-01-29 DIAGNOSIS — R293 Abnormal posture: Secondary | ICD-10-CM

## 2020-01-29 DIAGNOSIS — M545 Low back pain, unspecified: Secondary | ICD-10-CM

## 2020-01-29 DIAGNOSIS — M6281 Muscle weakness (generalized): Secondary | ICD-10-CM

## 2020-01-29 NOTE — Therapy (Addendum)
[facility] [address] [place], [place], [address] Phone: [phone]   Fax:  [phone]  Physical Therapy Treatment  Patient Details  Name: Audrey Baxter MRN: [id number] Date of Birth: 1956-09-27 Referring Provider (PT): Cari Caraway, MD   Encounter Date: 01/29/2020  PT End of Session - 01/29/20 0849    Visit Number  7    Date for PT Re-Evaluation  02/19/20    Authorization Type  Medicare/AARP    PT Start Time  0848    PT Stop Time  0926    PT Time Calculation (min)  38 min    Activity Tolerance  Patient tolerated treatment well    Behavior During Therapy  [facility] for tasks assessed/performed       Past Medical History:  Diagnosis Date  . Anxiety   . Arthritis   . Cancer ([place])    cervical/melanoma  . Depression   . GERD (gastroesophageal reflux disease)   . Heart murmur   . History of colon polyps   . Hyperlipidemia   . Hypertension   . Insomnia   . Osteoporosis   . Radial fracture    Right  . Tremor     Past Surgical History:  Procedure Laterality Date  . BREAST CYST EXCISION Left   . BREAST SURGERY     Left lumpectomy  . BUNIONECTOMY Bilateral   . CARDIAC CATHETERIZATION     15 years ago  . OPEN REDUCTION INTERNAL FIXATION (ORIF) DISTAL RADIAL FRACTURE Right 08/19/2017   Procedure: OPEN REDUCTION INTERNAL FIXATION (ORIF) DISTAL RADIAL FRACTURE WITH REPAIR RECONSTRUCTION, ALLOGRAFT BONE GRAFT AS NECESSARY;  Surgeon: Roseanne Kaufman, MD;  Location: [facility];  Service: Orthopedics;  Laterality: Right;  90 MINS  . WRIST ARTHROPLASTY  2007    There were no vitals filed for this visit.  Subjective Assessment - 01/29/20 0852    Subjective  I over did my yoga yesterday, my knees are a bit achey.    Pertinent History  osteoporosis, HTN,    Limitations  Walking    Currently in Pain?  Yes   Knees sore        [facility] PT Assessment - 01/29/20 0001      Assessment   Medical Diagnosis   osteoporosis of lumbar spine    Referring Provider (PT)  Cari Caraway, MD      Observation/Other Assessments   Focus on Therapeutic Outcomes (FOTO)   23% limited      AROM   Overall AROM Comments  Side bend bil WNL      Strength   Overall Strength Comments  Bil hip flexors 5/5, General shoulders 4+/5                   [facility] Adult PT Treatment/Exercise - 01/29/20 0001      Lumbar Exercises: Aerobic   Recumbent Bike  L2 x 5 min for LE warm up and discussion of current status             PT Education - 01/29/20 0859    Education Details  Final HEP: Review of safe gym exercises including core exercises.Added dying bug to HEP.    Person(s) Educated  Patient    Methods  Explanation;Demonstration;Verbal cues;Handout    Comprehension  Returned demonstration;Verbalized understanding       PT Short Term Goals - 01/29/20 0904      PT SHORT TERM GOAL #1   Title  be independent in initial HEP  Time  4    Period  Weeks    Status  Achieved    Target Date  01/22/20      PT SHORT TERM GOAL #2   Title  report a 25% reductin in LBP with standing and walking    Time  4    Period  Weeks    Status  Achieved   80%   Target Date  01/22/20      PT SHORT TERM GOAL #3   Title  sleep with 25% fewer sleep interruptions due to LBP    Time  4    Period  Weeks    Status  Achieved        PT Long Term Goals - 01/29/20 1856      PT LONG TERM GOAL #1   Title  be independent in advanced HEP    Time  8    Period  Weeks    Status  Achieved      PT LONG TERM GOAL #3   Title  verbalize and demonstrate understanding of body mechanics and activity modifications for lumbar protection and to reduce risk of fracture    Time  8    Period  Weeks    Status  Achieved      PT LONG TERM GOAL #4   Title  sleep without limitation due to LBP    Time  8    Period  Weeks    Status  Achieved      PT LONG TERM GOAL #5   Title  walk for exercise without increased LBP the next day     Time  8    Period  Weeks    Status  Achieved   80% improved but typically no INCREASES in pain.           Plan - 01/29/20 0850    Clinical Impression Statement  Pt has met all goals. She reports an overall improvement of 80%-90% since her eval including reducing her FOTO score to 23%. Pt is independent and compliant with her entire HEP/doing yoga 1-2x week and is considering a return to the gym. Today we covered safe gym exercises and equipment.    Personal Factors and Comorbidities  Comorbidity 1    Comorbidities  osteoporosis    Examination-Activity Limitations  Locomotion Level;Sleep    Stability/Clinical Decision Making  Stable/Uncomplicated    Rehab Potential  Good    PT Frequency  2x / week    PT Duration  8 weeks    PT Treatment/Interventions  ADLs/Self Care Home Management;Cryotherapy;Electrical Stimulation;Moist Heat;Neuromuscular re-education;Therapeutic exercise;Therapeutic activities;Patient/family education;Manual techniques;Dry needling;Passive range of motion;Taping    PT Next Visit Plan  Discharge to HEP.    PT Home Exercise Plan  Access Code: [email or web address]    Consulted and Agree with Plan of Care  Patient       Patient will benefit from skilled therapeutic intervention in order to improve the following deficits and impairments:  Decreased activity tolerance, Decreased strength, Postural dysfunction, Improper body mechanics, Pain, Increased muscle spasms  Visit Diagnosis: Acute bilateral low back pain without sciatica  Abnormal posture  Muscle weakness (generalized)     Problem List Patient Active Problem List   Diagnosis Date Noted  . Raynaud's disease 10/19/2018  . RLS (restless legs syndrome) 10/24/2017  . Pap smear for cervical cancer screening 09/15/2017  . Closed fracture of right distal radius 08/19/2017  . Physical exam 05/26/2017  . HTN (hypertension) 11/24/2016  . Hyperlipidemia  11/24/2016  . Major depression, recurrent ([place]) 11/24/2016  .  Right knee pain 08/06/2013  . Hip pain, bilateral 11/16/2011  . Leg length inequality 11/16/2011     Myrene Galas, PTA 01/29/20 10:07 AM PHYSICAL THERAPY DISCHARGE SUMMARY  Visits from Start of Care: 7  Current functional level related to goals / functional outcomes: Pt has met all goals.  See above for final status.    Remaining deficits: No deficits remain.  Pt will continue with HEP and will consider joining a gym.     Education / Equipment: HEP, safe gym practices Plan: Patient agrees to discharge.  Patient goals were met. Patient is being discharged due to meeting the stated rehab goals.  ?????        Sigurd Sos, PT 01/29/20 1:32 PM  [facility] [address], [place], [place], [address] Phone: [phone]   Fax:  [phone]  Name: RONIT CRANFIELD MRN: [id number] Date of Birth: 18-Jul-1956 Access Code: [email or web address]  URL: [email or web address]  Date: 01/29/2020  Prepared by: Myrene Galas   Exercises  Seated Shoulder Abduction - Palms Down - 10 reps - 2 sets - 2x daily - 7x weekly  Seated Shoulder Flexion - 10 reps - 2 sets - 2x daily - 7x weekly  Seated Shoulder Scaption - 10 reps - 2 sets - 2x daily - 7x weekly  Seated Correct Posture - 10 reps - 3 sets - 1x daily - 7x weekly  Seated Hamstring Stretch - 10 reps - 3 sets - 1x daily - 7x weekly  Supine Transversus Abdominis Bracing - Hands on Stomach - 10 reps - 3 sets - 1x daily - 7x weekly  Seated Transversus Abdominis Bracing - 10 reps - 3 sets - 1x daily - 7x weekly  Supine Shoulder Horizontal Abduction with Resistance - 5 reps - 1 sets - 1x daily - 7x weekly  Seated Shoulder Diagonal with Resistance - 5 reps - 3 sets - 1x daily - 7x weekly  Supine Hamstring Stretch with Strap - 3 reps - 30 hold - 1x daily - 7x weekly  Hooklying Single Knee to Chest Stretch - 2 reps - 30 hold - 1x daily - 7x weekly  Supine Figure 4  Piriformis Stretch - 2 reps - 30 hold - 1x daily - 7x weekly  Hooklying Clamshell with Resistance - 10 reps - 2 sets - 1x daily - 7x weekly  Supine Bridge - 10 reps - 1 sets - 3 hold - 1x daily - 7x weekly  Clamshell with Resistance - 10 reps - 2 sets - 1 hold - 1x daily - 7x weekly  Plank on Forearms with Scapular Protraction Retraction AROM - 3 reps - 10 hold - 1x daily - 3x weekly  Supine Dead Bug with Leg Extension - 10 reps - 2 sets - 1x daily - 5x weekly

## 2020-08-19 ENCOUNTER — Other Ambulatory Visit: Payer: Self-pay | Admitting: Interventional Cardiology

## 2020-09-02 ENCOUNTER — Other Ambulatory Visit: Payer: Self-pay | Admitting: Family Medicine

## 2020-09-02 ENCOUNTER — Other Ambulatory Visit (HOSPITAL_COMMUNITY)
Admission: RE | Admit: 2020-09-02 | Discharge: 2020-09-02 | Disposition: A | Discharge status: Home / Self Care | Payer: Medicare Other | Source: Ambulatory Visit | Attending: Family Medicine | Admitting: Family Medicine

## 2020-09-02 DIAGNOSIS — Z124 Encounter for screening for malignant neoplasm of cervix: Secondary | ICD-10-CM | POA: Diagnosis present

## 2020-09-02 DIAGNOSIS — Z1151 Encounter for screening for human papillomavirus (HPV): Secondary | ICD-10-CM | POA: Diagnosis not present

## 2020-09-07 LAB — CYTOLOGY - PAP
Comment: NEGATIVE
Diagnosis: NEGATIVE
High risk HPV: NEGATIVE

## 2020-09-11 ENCOUNTER — Other Ambulatory Visit: Payer: Medicare Other

## 2020-09-22 ENCOUNTER — Ambulatory Visit: Payer: Medicare Other | Attending: Internal Medicine

## 2020-09-22 DIAGNOSIS — Z23 Encounter for immunization: Secondary | ICD-10-CM

## 2020-09-22 NOTE — Progress Notes (Signed)
   Covid-19 Vaccination Clinic  Name:  Audrey Baxter    MRN: [id number] DOB: 03/16/1956  09/22/2020  [name] was observed post Covid-19 immunization for 15 minutes without incident. She was provided with Vaccine Information Sheet and instruction to access the V-Safe system.   [name] was instructed to call 911 with any severe reactions post vaccine: Marland Kitchen Difficulty breathing  . Swelling of face and throat  . A fast heartbeat  . A bad rash all over body  . Dizziness and weakness

## 2020-09-24 ENCOUNTER — Other Ambulatory Visit: Payer: Self-pay | Admitting: Interventional Cardiology

## 2020-11-17 ENCOUNTER — Other Ambulatory Visit: Payer: Self-pay | Admitting: Family Medicine

## 2020-11-17 DIAGNOSIS — Z1231 Encounter for screening mammogram for malignant neoplasm of breast: Secondary | ICD-10-CM

## 2020-12-29 ENCOUNTER — Other Ambulatory Visit: Payer: Self-pay

## 2020-12-29 ENCOUNTER — Ambulatory Visit
Admission: RE | Admit: 2020-12-29 | Discharge: 2020-12-29 | Disposition: A | Discharge status: Home / Self Care | Payer: Medicare Other | Source: Ambulatory Visit | Attending: Family Medicine | Admitting: Family Medicine

## 2020-12-29 DIAGNOSIS — Z1231 Encounter for screening mammogram for malignant neoplasm of breast: Secondary | ICD-10-CM

## 2021-09-10 ENCOUNTER — Other Ambulatory Visit: Payer: Self-pay | Admitting: Family Medicine

## 2021-09-10 DIAGNOSIS — M810 Age-related osteoporosis without current pathological fracture: Secondary | ICD-10-CM

## 2021-09-17 ENCOUNTER — Other Ambulatory Visit: Payer: Self-pay | Admitting: Family Medicine

## 2021-09-17 DIAGNOSIS — Z1231 Encounter for screening mammogram for malignant neoplasm of breast: Secondary | ICD-10-CM

## 2021-12-30 ENCOUNTER — Other Ambulatory Visit: Payer: Self-pay

## 2021-12-30 ENCOUNTER — Ambulatory Visit
Admission: RE | Admit: 2021-12-30 | Discharge: 2021-12-30 | Disposition: A | Discharge status: Home / Self Care | Payer: Medicare Other | Source: Ambulatory Visit | Attending: Family Medicine | Admitting: Family Medicine

## 2021-12-30 DIAGNOSIS — Z1231 Encounter for screening mammogram for malignant neoplasm of breast: Secondary | ICD-10-CM

## 2022-01-14 ENCOUNTER — Other Ambulatory Visit: Payer: Medicare Other

## 2022-02-03 ENCOUNTER — Ambulatory Visit
Admission: RE | Admit: 2022-02-03 | Discharge: 2022-02-03 | Disposition: A | Discharge status: Home / Self Care | Payer: Medicare Other | Source: Ambulatory Visit | Attending: Family Medicine | Admitting: Family Medicine

## 2022-02-03 ENCOUNTER — Other Ambulatory Visit: Payer: Self-pay

## 2022-02-03 DIAGNOSIS — M810 Age-related osteoporosis without current pathological fracture: Secondary | ICD-10-CM

## 2022-09-26 ENCOUNTER — Other Ambulatory Visit: Payer: Self-pay | Admitting: Family Medicine

## 2022-09-26 DIAGNOSIS — R0989 Other specified symptoms and signs involving the circulatory and respiratory systems: Secondary | ICD-10-CM

## 2022-10-03 ENCOUNTER — Ambulatory Visit
Admission: RE | Admit: 2022-10-03 | Discharge: 2022-10-03 | Disposition: A | Discharge status: Home / Self Care | Payer: Medicare Other | Source: Ambulatory Visit | Attending: Family Medicine | Admitting: Family Medicine

## 2022-10-03 DIAGNOSIS — R0989 Other specified symptoms and signs involving the circulatory and respiratory systems: Secondary | ICD-10-CM

## 2023-07-12 ENCOUNTER — Other Ambulatory Visit (HOSPITAL_BASED_OUTPATIENT_CLINIC_OR_DEPARTMENT_OTHER): Payer: Self-pay | Admitting: Pain Medicine

## 2023-07-12 ENCOUNTER — Ambulatory Visit (HOSPITAL_BASED_OUTPATIENT_CLINIC_OR_DEPARTMENT_OTHER)
Admission: RE | Admit: 2023-07-12 | Discharge: 2023-07-12 | Disposition: A | Discharge status: Home / Self Care | Payer: Medicare Other | Source: Ambulatory Visit | Attending: Pain Medicine | Admitting: Pain Medicine

## 2023-07-12 DIAGNOSIS — U071 COVID-19: Secondary | ICD-10-CM | POA: Insufficient documentation

## 2023-12-18 ENCOUNTER — Other Ambulatory Visit: Payer: Self-pay | Admitting: Family Medicine

## 2023-12-18 DIAGNOSIS — Z1231 Encounter for screening mammogram for malignant neoplasm of breast: Secondary | ICD-10-CM

## 2024-01-09 ENCOUNTER — Ambulatory Visit
Admission: RE | Admit: 2024-01-09 | Discharge: 2024-01-09 | Disposition: A | Discharge status: Home / Self Care | Payer: Medicare Other | Source: Ambulatory Visit | Attending: Family Medicine | Admitting: Family Medicine

## 2024-01-09 DIAGNOSIS — Z1231 Encounter for screening mammogram for malignant neoplasm of breast: Secondary | ICD-10-CM

## 2024-02-14 NOTE — Progress Notes (Unsigned)
 [facility] [address] [place] [address] Phone: [phone] Subjective:   [name], am serving as a scribe for Dr. Antoine Primas.  I'm seeing this patient by the request  of:  Gweneth Dimitri, MD  CC: Bilateral shoulder pain  [id number]  Audrey Baxter is a 68 y.o. female coming in with complaint of B shoulder pain. Patient states pain for a couple of months, around 6 months. Has gradually been getting worse. R shoulder is the worse. Usually a deep ache, R can feel like a hot poker. Pain does wake her up at night. Haven't tried many home therapies. Exercise helps for temporary relief but makes it hurt much more in the long run. Have had cortisone shots before.      Past Medical History:  Diagnosis Date   Anxiety    Arthritis    Cancer ([facility])    cervical/melanoma   Depression    GERD (gastroesophageal reflux disease)    Heart murmur    History of colon polyps    Hyperlipidemia    Hypertension    Insomnia    Osteoporosis    Radial fracture    Right   Tremor    Past Surgical History:  Procedure Laterality Date   BREAST CYST EXCISION Left    BREAST SURGERY     Left lumpectomy   BUNIONECTOMY Bilateral    CARDIAC CATHETERIZATION     15 years ago   OPEN REDUCTION INTERNAL FIXATION (ORIF) DISTAL RADIAL FRACTURE Right 08/19/2017   Procedure: OPEN REDUCTION INTERNAL FIXATION (ORIF) DISTAL RADIAL FRACTURE WITH REPAIR RECONSTRUCTION, ALLOGRAFT BONE GRAFT AS NECESSARY;  Surgeon: Dominica Severin, MD;  Location: [facility];  Service: Orthopedics;  Laterality: Right;  90 MINS   WRIST ARTHROPLASTY  2007   Social History   Socioeconomic History   Marital status: Married    Spouse name: Not on file   Number of children: Not on file   Years of education: Not on file   Highest education level: Not on file  Occupational History   Occupation: chaplain    Comment: [employer]  Tobacco Use   Smoking status: Former    Current packs/day: 0.00     Types: Cigarettes    Quit date: 12/19/1978    Years since quitting: 45.1   Smokeless tobacco: Never  Vaping Use   Vaping status: Never Used  Substance and Sexual Activity   Alcohol use: Yes    Alcohol/week: 0.0 standard drinks of alcohol    Comment: glass wine every other night   Drug use: No   Sexual activity: Yes    Comment: 1st intercourse 43 yo-5 partners  Other Topics Concern   Not on file  Social History Narrative   Not on file   Social Drivers of Health   Financial Resource Strain: Not on file  Food Insecurity: Not on file  Transportation Needs: Not on file  Physical Activity: Not on file  Stress: Not on file  Social Connections: Not on file   Allergies  Allergen Reactions   Pyridium  [Phenazopyridine Hcl] Other (See Comments)   Atorvastatin Other (See Comments)    LEG CRAMPS    Erythromycin Base Other (See Comments)    UPSETS STOMACH    Pyridium [Phenazopyridine Hcl] Other (See Comments)    TUNNEL VISION   Simvastatin Other (See Comments)    Other reaction(s): muscle aches   Cephalexin Rash   Vancomycin Itching and Rash   Family History  Problem Relation Age of Onset   COPD Mother    Cancer Father        lung, bladder   Alcohol abuse Father    HIV/AIDS Brother    Breast cancer Maternal Aunt 36   Alcohol abuse Paternal Aunt    Stroke Maternal Grandmother    Heart attack Maternal Grandfather    Cancer Paternal Grandfather        lung/spine   Cancer Brother        lung     Current Outpatient Medications (Cardiovascular):    nitroGLYCERIN (NITRO-DUR) 0.2 mg/hr patch, Apply 1/4 of a patch to skin once daily.   amLODipine (NORVASC) 10 MG tablet, Take 1 tablet (10 mg total) by mouth daily.   lisinopril-hydrochlorothiazide (PRINZIDE,ZESTORETIC) 10-12.5 MG tablet, Take 1 tablet by mouth daily.   rosuvastatin (CRESTOR) 10 MG tablet, Take 1 tablet (10 mg total) by mouth daily.  Current Outpatient Medications (Respiratory):    loratadine (CLARITIN) 10 MG  tablet, Take 10 mg by mouth daily.  Current Outpatient Medications (Analgesics):    ibuprofen (ADVIL,MOTRIN) 200 MG tablet, Take 400 mg by mouth 3 (three) times daily as needed for mild pain.  Current Outpatient Medications (Hematological):    cyanocobalamin 1000 MCG tablet, Take 1,000 mcg by mouth daily.  Current Outpatient Medications (Other):    ARIPiprazole (ABILIFY) 10 MG tablet, Take 5 mg by mouth every evening.    buPROPion (WELLBUTRIN XL) 300 MG 24 hr tablet, Take 300 mg by mouth daily.   CALCIUM-VITAMIN D PO, Take 1 tablet by mouth 2 (two) times daily.   clonazePAM (KLONOPIN) 0.5 MG tablet, Take 0.5 mg by mouth daily as needed for anxiety.   desvenlafaxine (PRISTIQ) 100 MG 24 hr tablet, Take 100 mg by mouth daily.   estradiol (ESTRING) 2 MG vaginal ring, Place 2 mg vaginally every 3 (three) months. follow package directions   hydrocortisone cream 0.5 %, Apply 1 application topically daily as needed for itching.    Omega-3 Fatty Acids (FISH OIL) 1000 MG CAPS, Take 1,000 mg by mouth daily.   pantoprazole (PROTONIX) 40 MG tablet, TAKE 1 TABLET BY MOUTH ONCE DAILY (Patient not taking: Reported on 12/25/2019)   primidone (MYSOLINE) 50 MG tablet, Take 2 tablets (100 mg total) by mouth 2 (two) times daily.   ROYAL JELLY PO, Take 1 capsule by mouth daily as needed (energy).   typhoid (VIVOTIF) DR capsule, Take 1 capsule by mouth every other day. (Patient not taking: Reported on 12/25/2019)   zolpidem (AMBIEN) 10 MG tablet, Take 10 mg by mouth at bedtime as needed for sleep.    Reviewed prior external information including notes and imaging from  primary care provider As well as notes that were available from care everywhere and other healthcare systems.  Past medical history, social, surgical and family history all reviewed in electronic medical record.  No pertanent information unless stated regarding to the chief complaint.   Review of Systems:  No headache, visual changes, nausea,  vomiting, diarrhea, constipation, dizziness, abdominal pain, skin rash, fevers, chills, night sweats, weight loss, swollen lymph nodes, body aches, joint swelling, chest pain, shortness of breath, mood changes. POSITIVE muscle aches  Objective  Blood pressure 122/74, height 5\' 3"  (1.6 m), weight 150 lb (68 kg).   General: No apparent distress alert and oriented x3 mood and affect normal, dressed appropriately.  HEENT: Pupils equal, extraocular movements intact  Respiratory: Patient's speak in full sentences and does not appear short of breath  Cardiovascular:  No lower extremity edema, non tender, no erythema  Patient does have weakness noted of the rotator cuff on the right side.  Fairly severe overall. Limited muscular skeletal ultrasound was performed and interpreted by Antoine Primas, M  Limited ultrasound shows a patient does have a fairly large rotator cuff tear of the supraspinatus that does have some chronic changes noted with some atrophy.  Seems to be full-thickness. Impression: Severe rotator cuff tear appreciated.    Impression and Recommendations:     The above documentation has been reviewed and is accurate and complete Judi Saa, DO

## 2024-02-16 ENCOUNTER — Encounter: Payer: Self-pay | Admitting: Family Medicine

## 2024-02-16 ENCOUNTER — Ambulatory Visit: Payer: Medicare Other | Admitting: Family Medicine

## 2024-02-16 ENCOUNTER — Other Ambulatory Visit: Payer: Self-pay

## 2024-02-16 VITALS — BP 122/74 | Ht 63.0 in | Wt 150.0 lb

## 2024-02-16 DIAGNOSIS — M25512 Pain in left shoulder: Secondary | ICD-10-CM | POA: Diagnosis not present

## 2024-02-16 DIAGNOSIS — M75121 Complete rotator cuff tear or rupture of right shoulder, not specified as traumatic: Secondary | ICD-10-CM

## 2024-02-16 DIAGNOSIS — M75101 Unspecified rotator cuff tear or rupture of right shoulder, not specified as traumatic: Secondary | ICD-10-CM

## 2024-02-16 DIAGNOSIS — M25511 Pain in right shoulder: Secondary | ICD-10-CM | POA: Diagnosis not present

## 2024-02-16 HISTORY — DX: Unspecified rotator cuff tear or rupture of right shoulder, not specified as traumatic: M75.101

## 2024-02-16 MED ORDER — NITROGLYCERIN 0.2 MG/HR TD PT24: MEDICATED_PATCH | TRANSDERMAL | 0 refills | Status: AC

## 2024-02-16 NOTE — Patient Instructions (Addendum)
 [facility] [phone] Call Today  When we receive your results we will contact you. Prescription for Nitropatches Nitroglycerin Protocol   Apply 1/4 nitroglycerin patch to affected area daily.  Change position of patch within the affected area every 24 hours.  You may experience a headache during the first 1-2 weeks of using the patch, these should subside.  If you experience headaches after beginning nitroglycerin patch treatment, you may take your preferred over the counter pain reliever.  Another side effect of the nitroglycerin patch is skin irritation or rash related to patch adhesive.  Please notify our office if you develop more severe headaches or rash, and stop the patch.  Tendon healing with nitroglycerin patch may require 12 to 24 weeks depending on the extent of injury.  Men should not use if taking Viagra, Cialis, or Levitra.   Do not use if you have migraines or rosacea.  3 ibuprofens 3 times a day for 3 days Keep hands within peripheral vision See you again in 6-8 weeks

## 2024-02-16 NOTE — Assessment & Plan Note (Signed)
 Patient does have what appears to be a near full-thickness if not a full-thickness rotator cuff tear of the supraspinatus noted.  Seems to be greater than 94 weeks old at this moment.  We discussed with patient different treatment options.  We did discuss icing regimen, home exercises, which activities to do and which ones to avoid.  At this point patient has done with formal physical therapy.  Patient has had multiple injections as well from another provider.  Does have x-rays previously as well.  At this point I do think advanced imaging is warranted to see if patient would be a candidate for the possibility of PRP injection or the possibility of needing surgical intervention.  Patient is happy with this.  Will get imaging and discuss further at that time

## 2024-02-17 ENCOUNTER — Ambulatory Visit
Admission: RE | Admit: 2024-02-17 | Discharge: 2024-02-17 | Disposition: A | Discharge status: Home / Self Care | Payer: Medicare Other | Source: Ambulatory Visit | Attending: Family Medicine | Admitting: Family Medicine

## 2024-02-17 DIAGNOSIS — M25511 Pain in right shoulder: Secondary | ICD-10-CM

## 2024-02-22 ENCOUNTER — Encounter: Payer: Self-pay | Admitting: Family Medicine

## 2024-03-27 NOTE — Progress Notes (Unsigned)
 [facility] [address] [place] [address] Phone: [phone] Subjective:   Audrey Baxter, am serving as a scribe for Dr. Antoine Primas.  I'm seeing this patient by the request  of:  Gweneth Dimitri, MD  CC: Right shoulder pain follow-up  EXB:[id number]  02/16/2024 Patient does have what appears to be a near full-thickness if not a full-thickness rotator cuff tear of the supraspinatus noted. Seems to be greater than 6 weeks old at this moment. We discussed with patient different treatment options. We did discuss icing regimen, home exercises, which activities to do and which ones to avoid. At this point patient has done with formal physical therapy. Patient has had multiple injections as well from another provider. Does have x-rays previously as well. At this point I do think advanced imaging is warranted to see if patient would be a candidate for the possibility of PRP injection or the possibility of needing surgical intervention. Patient is happy with this. Will get imaging and discuss further at that time   Update 03/28/2024 Audrey Baxter is a 68 y.o. female coming in with complaint of R shoulder pain. Patient states that she is doing PT and has great mobility.   MRI R shoulder 02/17/2024 IMPRESSION: 1. Rotator cuff and intra-articular long head of biceps tendinosis without tear. Mild fissuring at the conjoined supraspinatus and infraspinatus noted. 2. Moderate glenohumeral and mild acromioclavicular osteoarthritis. 3. Subacromial/subdeltoid fluid consistent with bursitis.     Past Medical History:  Diagnosis Date   Anxiety    Arthritis    Cancer (HCC)    cervical/melanoma   Depression    GERD (gastroesophageal reflux disease)    Heart murmur    History of colon polyps    Hyperlipidemia    Hypertension    Insomnia    Osteoporosis    Radial fracture    Right   Tremor    Past Surgical History:  Procedure Laterality Date    BREAST CYST EXCISION Left    BREAST SURGERY     Left lumpectomy   BUNIONECTOMY Bilateral    CARDIAC CATHETERIZATION     15 years ago   OPEN REDUCTION INTERNAL FIXATION (ORIF) DISTAL RADIAL FRACTURE Right 08/19/2017   Procedure: OPEN REDUCTION INTERNAL FIXATION (ORIF) DISTAL RADIAL FRACTURE WITH REPAIR RECONSTRUCTION, ALLOGRAFT BONE GRAFT AS NECESSARY;  Surgeon: Dominica Severin, MD;  Location: [facility] OR;  Service: Orthopedics;  Laterality: Right;  90 MINS   WRIST ARTHROPLASTY  2007   Social History   Socioeconomic History   Marital status: Married    Spouse name: Not on file   Number of children: Not on file   Years of education: Not on file   Highest education level: Not on file  Occupational History   Occupation: chaplain    Comment: [employer]  Tobacco Use   Smoking status: Former    Current packs/day: 0.00    Types: Cigarettes    Quit date: 12/19/1978    Years since quitting: 45.3   Smokeless tobacco: Never  Vaping Use   Vaping status: Never Used  Substance and Sexual Activity   Alcohol use: Yes    Alcohol/week: 0.0 standard drinks of alcohol    Comment: glass wine every other night   Drug use: No   Sexual activity: Yes    Comment: 1st intercourse 42 yo-5 partners  Other Topics Concern   Not on file  Social History Narrative   Not on file   Social Drivers of  Health   Financial Resource Strain: Not on file  Food Insecurity: Not on file  Transportation Needs: Not on file  Physical Activity: Not on file  Stress: Not on file  Social Connections: Not on file   Allergies  Allergen Reactions   Pyridium  [Phenazopyridine Hcl] Other (See Comments)   Atorvastatin Other (See Comments)    LEG CRAMPS    Erythromycin Base Other (See Comments)    UPSETS STOMACH    Pyridium [Phenazopyridine Hcl] Other (See Comments)    TUNNEL VISION   Simvastatin Other (See Comments)    Other reaction(s): muscle aches   Cephalexin Rash   Vancomycin Itching and Rash   Family History  Problem  Relation Age of Onset   COPD Mother    Cancer Father        lung, bladder   Alcohol abuse Father    HIV/AIDS Brother    Breast cancer Maternal Aunt 50   Alcohol abuse Paternal Aunt    Stroke Maternal Grandmother    Heart attack Maternal Grandfather    Cancer Paternal Grandfather        lung/spine   Cancer Brother        lung     Current Outpatient Medications (Cardiovascular):    amLODipine (NORVASC) 10 MG tablet, Take 1 tablet (10 mg total) by mouth daily.   lisinopril-hydrochlorothiazide (PRINZIDE,ZESTORETIC) 10-12.5 MG tablet, Take 1 tablet by mouth daily.   nitroGLYCERIN (NITRO-DUR) 0.2 mg/hr patch, Apply 1/4 of a patch to skin once daily.   rosuvastatin (CRESTOR) 10 MG tablet, Take 1 tablet (10 mg total) by mouth daily.  Current Outpatient Medications (Respiratory):    loratadine (CLARITIN) 10 MG tablet, Take 10 mg by mouth daily.  Current Outpatient Medications (Analgesics):    ibuprofen (ADVIL,MOTRIN) 200 MG tablet, Take 400 mg by mouth 3 (three) times daily as needed for mild pain.  Current Outpatient Medications (Hematological):    cyanocobalamin 1000 MCG tablet, Take 1,000 mcg by mouth daily.  Current Outpatient Medications (Other):    ARIPiprazole (ABILIFY) 10 MG tablet, Take 5 mg by mouth every evening.    buPROPion (WELLBUTRIN XL) 300 MG 24 hr tablet, Take 300 mg by mouth daily.   CALCIUM-VITAMIN D PO, Take 1 tablet by mouth 2 (two) times daily.   clonazePAM (KLONOPIN) 0.5 MG tablet, Take 0.5 mg by mouth daily as needed for anxiety.   desvenlafaxine (PRISTIQ) 100 MG 24 hr tablet, Take 100 mg by mouth daily.   estradiol (ESTRING) 2 MG vaginal ring, Place 2 mg vaginally every 3 (three) months. follow package directions   hydrocortisone cream 0.5 %, Apply 1 application topically daily as needed for itching.    Omega-3 Fatty Acids (FISH OIL) 1000 MG CAPS, Take 1,000 mg by mouth daily.   pantoprazole (PROTONIX) 40 MG tablet, TAKE 1 TABLET BY MOUTH ONCE DAILY (Patient  not taking: Reported on 12/25/2019)   primidone (MYSOLINE) 50 MG tablet, Take 2 tablets (100 mg total) by mouth 2 (two) times daily.   ROYAL JELLY PO, Take 1 capsule by mouth daily as needed (energy).   typhoid (VIVOTIF) DR capsule, Take 1 capsule by mouth every other day. (Patient not taking: Reported on 12/25/2019)   zolpidem (AMBIEN) 10 MG tablet, Take 10 mg by mouth at bedtime as needed for sleep.    Reviewed prior external information including notes and imaging from  primary care provider As well as notes that were available from care everywhere and other healthcare systems.  Past medical history,  social, surgical and family history all reviewed in electronic medical record.  No pertanent information unless stated regarding to the chief complaint.   Review of Systems:  No headache, visual changes, nausea, vomiting, diarrhea, constipation, dizziness, abdominal pain, skin rash, fevers, chills, night sweats, weight loss, swollen lymph nodes, body aches, joint swelling, chest pain, shortness of breath, mood changes. POSITIVE muscle aches  Objective  There were no vitals taken for this visit.   General: No apparent distress alert and oriented x3 mood and affect normal, dressed appropriately.  HEENT: Pupils equal, extraocular movements intact  Respiratory: Patient's speak in full sentences and does not appear short of breath  Cardiovascular: No lower extremity edema, non tender, no erythema  Right shoulder exam shows impingement noted.  Bilateral shoulders do have some weakness with 4 out of 5 strength.  Patient though has improved and some of the scapular strengthening.   Procedure: Real-time Ultrasound Guided Injection of right glenohumeral joint Device: GE Logiq Q7  Ultrasound guided injection is preferred based studies that show increased duration, increased effect, greater accuracy, decreased procedural pain, increased response rate with ultrasound guided versus blind injection.  Verbal  informed consent obtained.  Time-out conducted.  Noted no overlying erythema, induration, or other signs of local infection.  Skin prepped in a sterile fashion.  Local anesthesia: Topical Ethyl chloride.  With sterile technique and under real time ultrasound guidance:  Joint visualized.  23g 1  inch needle inserted posterior approach. Pictures taken for needle placement. Patient did have injection of 2 cc of 0.5% Marcaine, and 1.0 cc of Kenalog 40 mg/dL. Completed without difficulty  Pain immediately resolved suggesting accurate placement of the medication.  Advised to call if fevers/chills, erythema, induration, drainage, or persistent bleeding.  Images saved Impression: Technically successful ultrasound guided injection.   Procedure: Real-time Ultrasound Guided Injection of left glenohumeral joint Device: GE Logiq E  Ultrasound guided injection is preferred based studies that show increased duration, increased effect, greater accuracy, decreased procedural pain, increased response rate with ultrasound guided versus blind injection.  Verbal informed consent obtained.  Time-out conducted.  Noted no overlying erythema, induration, or other signs of local infection.  Skin prepped in a sterile fashion.  Local anesthesia: Topical Ethyl chloride.  With sterile technique and under real time ultrasound guidance:  Joint visualized.  21g 2 inch needle inserted posterior approach. Pictures taken for needle placement. Patient did have injection of 2 cc of 0.5% Marcaine, and 1cc of Kenalog 40 mg/dL. Completed without difficulty  Pain immediately resolved suggesting accurate placement of the medication.  Advised to call if fevers/chills, erythema, induration, drainage, or persistent bleeding.  Images permanently stored Impression: Technically successful ultrasound guided injection.   Impression and Recommendations:    The above documentation has been reviewed and is accurate and complete Judi Saa, DO

## 2024-03-28 ENCOUNTER — Encounter: Payer: Self-pay | Admitting: Family Medicine

## 2024-03-28 ENCOUNTER — Other Ambulatory Visit: Payer: Self-pay

## 2024-03-28 ENCOUNTER — Ambulatory Visit: Admitting: Family Medicine

## 2024-03-28 VITALS — BP 110/72 | HR 78 | Ht 63.0 in | Wt 152.0 lb

## 2024-03-28 DIAGNOSIS — M12811 Other specific arthropathies, not elsewhere classified, right shoulder: Secondary | ICD-10-CM

## 2024-03-28 DIAGNOSIS — M12812 Other specific arthropathies, not elsewhere classified, left shoulder: Secondary | ICD-10-CM | POA: Diagnosis not present

## 2024-03-28 DIAGNOSIS — M25511 Pain in right shoulder: Secondary | ICD-10-CM

## 2024-03-28 HISTORY — DX: Other specific arthropathies, not elsewhere classified, right shoulder: M12.811

## 2024-03-28 NOTE — Patient Instructions (Addendum)
 Dr. Ave Filter to discuss surgery. Injection in shoulders today. Return in 3 to 4 months.

## 2024-03-28 NOTE — Assessment & Plan Note (Addendum)
 Bilateral injections given today and tolerated the procedure well, discussed icing regimen and home exercises, discussed which activities to do and which ones to avoid.  We discussed patient's MRIs in great detail and we discussed that the rotator cuff tears are not considerably terrible but unfortunately due to the moderate to severe arthritic changes bilaterally I do think she would be talking about more of a replacement.  Patient feels like she can do all daily activities and pain is there but not considerable enough to affect quality of life tremendously enough to consider replacement.  Patient knows that we can repeat these injections every 3 months if necessary.  Had improvement in range of motion almost immediately.  Follow-up again in 10 to 12 weeks  At the end of the conversation I did discuss with her that I do feel it is appropriate for her to be referred to orthopedics to discuss the possibility of surgical intervention and to know what this would entail in the long run.  Patient is in agreement with the plan and would not want replacement at the earliest until November.

## 2024-04-08 ENCOUNTER — Ambulatory Visit: Payer: Medicare Other | Admitting: Family Medicine

## 2024-05-01 ENCOUNTER — Ambulatory Visit: Admitting: Family Medicine

## 2024-06-08 ENCOUNTER — Other Ambulatory Visit: Payer: Self-pay | Admitting: Family Medicine

## 2024-06-11 ENCOUNTER — Other Ambulatory Visit: Payer: Self-pay | Admitting: Family Medicine

## 2024-06-27 ENCOUNTER — Ambulatory Visit: Admitting: Family Medicine

## 2024-07-05 ENCOUNTER — Encounter: Payer: Self-pay | Admitting: Advanced Practice Midwife

## 2024-07-18 ENCOUNTER — Other Ambulatory Visit (HOSPITAL_BASED_OUTPATIENT_CLINIC_OR_DEPARTMENT_OTHER): Payer: Self-pay | Admitting: Family Medicine

## 2024-07-18 DIAGNOSIS — M810 Age-related osteoporosis without current pathological fracture: Secondary | ICD-10-CM

## 2024-09-05 ENCOUNTER — Encounter: Payer: Self-pay | Admitting: Physician Assistant

## 2024-10-05 NOTE — Progress Notes (Addendum)
 Assessment/Plan:   Audrey Baxter is a very pleasant 68 y.o. year old RH female with a history of hypertension, hyperlipidemia, anxiety, depression, GERD, arthritis, cervical cancer, melanoma lower R calf, ET  seen today for evaluation of memory concerns. MoCA today is 30/30. Low suspicion for neurodegenerative disease, but workup is in progress. Suspect memory changes may be multifactorial including a contribution of sleep, anxiety and depression.  Patient is able to participate on ADLs and to drive without significant difficulties. Mood is anxious, under the care of psychiatry    Memory difficulties of unclear etiology  MRI brain without contrast to assess for underlying structural abnormality and assess vascular load  Neurocognitive testing to further evaluate cognitive concerns and determine other underlying cause of memory changes, including potential contribution from sleep, anxiety, attention, or depression  Check B12, TSH, B1 Follow up anemia symptoms with PCP Continue to control mood as per psychiatry Recommend good control of cardiovascular risk factors Folllow up pending on the above results  Subjective:   The patient is here alone   How long did patient have memory difficulties? For the last 2-3 years mostly STM, such as some difficulty remembering new information, conversations and names.  Long-term memory is good.  repeats oneself?  Endorsed.  Disoriented when walking into a room?  Patient denies except occasionally not remembering what patient came to the room for    Leaving objects in unusual places? Denies.   Wandering behavior?  denies .  Any personality changes?  Denies.   Any history of depression?:  Endorsed, with anxiety. [facility]  Hallucinations or paranoia?  Denies   Seizures?  Denies    Any sleep changes? Does not sleep well, tried acupuncture. Denies nightmares, but does have vivid dreams, REM behavior or sleepwalking .  In  the past she took Ambien for a week and my husband told me I was acting strange. Sleep apnea?  I had several sleep tests without a diagnosis of OSA    Any hygiene concerns?  Denies   Independent of bathing and dressing?  Endorsed  Does the patient needs help with medications? Patient is in charge   Who is in charge of the finances? Patient is in charge     Any changes in appetite?  Denies. I eat more than before     Patient have trouble swallowing? Denies.   Does the patient cook? Yes, not very often  Any kitchen accidents such as leaving the stove on? Denies.   Any history of headaches?   Denies.   Chronic pain? Arthritic pain  Ambulates with difficulty?  Denies.   Recent falls or head injuries? Denies.   Vision changes? Denies.   Any stroke like symptoms? Denies.   Any tremors?  History of ET on propanolol and primidone   prescribed by psychiatry .  Any anosmia?  Denies.   Any incontinence of urine? Denies.   Any bowel dysfunction? Has IBS (constipation and diarrhea)   Patient lives with  husband   History of heavy alcohol intake? Denies.   History of heavy tobacco use?  2 glasses of wine at night Family history of dementia? MGM had dementia ?type Does patient drive? 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[employer], part civil engineer, contracting    Past Medical History:  Diagnosis Date   Anxiety    Arthritis    Cancer (HCC)    cervical/melanoma   Depression    GERD (gastroesophageal reflux  disease)    Heart murmur    History of colon polyps    Hyperlipidemia    Hypertension    Insomnia    Osteoporosis    Radial fracture    Right   Tremor      Past Surgical History:  Procedure Laterality Date   BREAST CYST EXCISION Left    BREAST SURGERY     Left lumpectomy   BUNIONECTOMY Bilateral    CARDIAC CATHETERIZATION     15 years ago   OPEN REDUCTION INTERNAL FIXATION (ORIF) DISTAL RADIAL FRACTURE Right 08/19/2017   Procedure: OPEN REDUCTION INTERNAL FIXATION (ORIF) DISTAL  RADIAL FRACTURE WITH REPAIR RECONSTRUCTION, ALLOGRAFT BONE GRAFT AS NECESSARY;  Surgeon: Camella Fallow, MD;  Location: [facility] OR;  Service: Orthopedics;  Laterality: Right;  90 MINS   WRIST ARTHROPLASTY  2007     Allergies  Allergen Reactions   Pyridium  [Phenazopyridine Hcl] Other (See Comments)   Atorvastatin Other (See Comments)    LEG CRAMPS    Erythromycin Base Other (See Comments)    UPSETS STOMACH    Pyridium [Phenazopyridine Hcl] Other (See Comments)    TUNNEL VISION   Simvastatin Other (See Comments)    Other reaction(s): muscle aches   Cephalexin Rash   Vancomycin  Itching and Rash    Current Outpatient Medications  Medication Instructions   amLODipine  (NORVASC ) 10 mg, Oral, Daily   ARIPiprazole (ABILIFY) 5 mg, Oral, Every evening   buPROPion (WELLBUTRIN XL) 300 mg, Oral, Daily   CALCIUM -VITAMIN D  PO 1 tablet, Oral, 2 times daily   cyanocobalamin 1,000 mcg, Oral, Daily   desvenlafaxine (PRISTIQ) 100 mg, Oral, Daily   estradiol  (ESTRING ) 2 mg, Vaginal, Every 3 months, follow package directions   Fish Oil 1,000 mg, Oral, Daily   hydrocortisone cream 0.5 % 1 application , Topical, Daily PRN   ibuprofen (ADVIL) 400 mg, Oral, 3 times daily PRN   lisinopril -hydrochlorothiazide  (PRINZIDE ,ZESTORETIC ) 10-12.5 MG tablet 1 tablet, Oral, Daily   loratadine (CLARITIN) 10 mg, Oral, Daily   nitroGLYCERIN  (NITRO-DUR ) 0.2 mg/hr patch Apply 1/4 of a patch to skin once daily.   pantoprazole  (PROTONIX ) 40 MG tablet TAKE 1 TABLET BY MOUTH ONCE DAILY   primidone  (MYSOLINE ) 100 mg, Oral, 2 times daily   rosuvastatin  (CRESTOR ) 10 mg, Oral, Daily   ROYAL JELLY PO 1 capsule, Oral, Daily PRN   typhoid (VIVOTIF) DR capsule 1 capsule, Oral, Every other day   zolpidem (AMBIEN) 10 mg, Oral, At bedtime PRN     VITALS:  There were no vitals filed for this visit.       10/11/2024   10:00 AM 12/23/2016    1:47 PM  Montreal Cognitive Assessment   Visuospatial/ Executive (0/5) 5 5  Naming (0/3) 3  3  Attention: Read list of digits (0/2) 2 2  Attention: Read list of letters (0/1) 1 1  Attention: Serial 7 subtraction starting at 100 (0/3) 3 3  Language: Repeat phrase (0/2) 2 2  Language : Fluency (0/1) 1 1  Abstraction (0/2) 2 2  Delayed Recall (0/5) 5 3  Orientation (0/6) 6 5  Total 30 27  Adjusted Score (based on education) 30 27        No data to display           PHYSICAL EXAM   HEENT:  Normocephalic, atraumatic. The superficial temporal arteries are without ropiness or tenderness. Cardiovascular: Regular rate and rhythm, soft 1/6 systolic murmur. Lungs: Clear to auscultation bilaterally. Neck: There are no carotid  bruits noted bilaterally.  Orientation:  Alert and oriented to person, place and time. No aphasia or dysarthria. Fund of knowledge is appropriate. Recent memory and remote memory intact.  Attention and concentration are normal.  Able to name objects and repeat phrases. Delayed recall  5/5 Cranial nerves: There is good facial symmetry. Anxious appearing. Extraocular muscles are intact and visual fields are full to confrontational testing. Speech is fluent and clear. No tongue deviation. Hearing is intact to conversational tone. Tone: Tone is good throughout. Abnormal movements: mild intention tremors L>R. No Asterixis. No Fasciculations Sensation: Sensation is intact to light touch. Vibration is intact at the bilateral big toe.  Coordination: The patient has no difficulty with RAM's or FNF bilaterally. Normal finger to nose  Motor: Strength is 5/5 in the bilateral upper and lower extremities. There is no pronator drift. There are no fasciculations noted. DTR's: Deep tendon reflexes are 2/4 bilaterally. Gait and Station: The patient is able to ambulate without difficulty The patient is able to heel toe walk. Gait is cautious and narrow. The patient is able to ambulate in a tandem fashion.       Thank you for allowing us  the opportunity to participate in the  care of this nice patient. Please do not hesitate to contact us  for any questions or concerns.   Total time spent on today's visit was 60 minutes dedicated to this patient today, preparing to see patient, examining the patient, ordering tests and/or medications and counseling the patient, documenting clinical information in the EHR or other health record, independently interpreting results and communicating results to the patient/family, discussing treatment and goals, answering patient's questions and coordinating care.  Cc:  Aisha Harvey, MD  Camie Sevin 10/05/2024 6:22 PM

## 2024-10-11 ENCOUNTER — Other Ambulatory Visit

## 2024-10-11 ENCOUNTER — Encounter: Payer: Self-pay | Admitting: Physician Assistant

## 2024-10-11 ENCOUNTER — Telehealth: Payer: Self-pay | Admitting: Physician Assistant

## 2024-10-11 ENCOUNTER — Ambulatory Visit

## 2024-10-11 ENCOUNTER — Ambulatory Visit (INDEPENDENT_AMBULATORY_CARE_PROVIDER_SITE_OTHER): Admitting: Physician Assistant

## 2024-10-11 VITALS — BP 149/80 | HR 80 | Resp 18 | Ht 63.0 in | Wt 150.0 lb

## 2024-10-11 DIAGNOSIS — R413 Other amnesia: Secondary | ICD-10-CM

## 2024-10-11 NOTE — Telephone Encounter (Signed)
 Pt called in this afternoon and she stated that she wants a return  call back. Thanks

## 2024-10-11 NOTE — Telephone Encounter (Signed)
 I advised to keep Mri and Neuropschy testing appts. Clarifed this to patient. She thanked me for calling.

## 2024-10-11 NOTE — Patient Instructions (Addendum)
 Mri brain at [facility] [phone] Labs today [address]  It was a pleasure to see you today at our office.   Recommendations:  Neurocognitive evaluation at our office   MRI of the brain, the radiology office will call you to arrange you appointment   Check labs today       [email or web address]   RECOMMENDATIONS FOR ALL PATIENTS WITH MEMORY PROBLEMS: 1. Continue to exercise (Recommend 30 minutes of walking everyday, or 3 hours every week) 2. Increase social interactions - continue going to [place] and enjoy social gatherings with friends and family 3. Eat healthy, avoid fried foods and eat more fruits and vegetables 4. Maintain adequate blood pressure, blood sugar, and blood cholesterol level. Reducing the risk of stroke and cardiovascular disease also helps promoting better memory. 5. Avoid stressful situations. Live a simple life and avoid aggravations. Organize your time and prepare for the next day in anticipation. 6. Sleep well, avoid any interruptions of sleep and avoid any distractions in the bedroom that may interfere with adequate sleep quality 7. Avoid sugar, avoid sweets as there is a strong link between excessive sugar intake, diabetes, and cognitive impairment We discussed the Mediterranean diet, which has been shown to help patients reduce the risk of progressive memory disorders and reduces cardiovascular risk. This includes eating fish, eat fruits and green leafy vegetables, nuts like almonds and hazelnuts, walnuts, and also use olive oil. Avoid fast foods and fried foods as much as possible. Avoid sweets and sugar as sugar use has been linked to worsening of memory function.  There is always a concern of gradual progression of memory problems. If this is the case, then we may need to adjust level of care according to patient needs. Support, both to the patient and caregiver, should then be put into place.       You have been referred for a neuropsychological evaluation (i.e., evaluation of memory and thinking abilities). Please bring someone with you to this appointment if possible, as it is helpful for the doctor to hear from both you and another adult who knows you well. Please bring eyeglasses and hearing aids if you wear them.    The evaluation will take approximately 3 hours and has two parts:   The first part is a clinical interview with the neuropsychologist (Dr. Richie or Dr. Gayland). During the interview, the neuropsychologist will speak with you and the individual you brought to the appointment.    The second part of the evaluation is testing with the doctor's technician Neal or Luke). During the testing, the technician will ask you to remember different types of material, solve problems, and answer some questionnaires. Your family member will not be present for this portion of the evaluation.   Please note: We must reserve several hours of the neuropsychologist's time and the psychometrician's time for your evaluation appointment. As such, there is a No-Show fee of $100. If you are unable to attend any of your appointments, please contact our office as soon as possible to reschedule.      DRIVING: Regarding driving, in patients with progressive memory problems, driving will be impaired. We advise to have someone else do the driving if trouble finding directions or if minor accidents are reported. Independent driving assessment is available to determine safety of driving.   If you are interested in the driving assessment, you can contact the following:  [facility] [employer] in [place] [phone]  [facility] [phone]  [facility]  [phone]  [facility] [phone] or [phone]   FALL PRECAUTIONS: Be cautious when walking. Scan the area for obstacles that may increase the risk of trips and falls. When getting up in the  mornings, sit up at the edge of the bed for a few minutes before getting out of bed. Consider elevating the bed at the head end to avoid drop of blood pressure when getting up. Walk always in a well-lit room (use night lights in the walls). Avoid area rugs or power cords from appliances in the middle of the walkways. Use a walker or a cane if necessary and consider physical therapy for balance exercise. Get your eyesight checked regularly.  FINANCIAL OVERSIGHT: Supervision, especially oversight when making financial decisions or transactions is also recommended.  HOME SAFETY: Consider the safety of the kitchen when operating appliances like stoves, microwave oven, and blender. Consider having supervision and share cooking responsibilities until no longer able to participate in those. Accidents with firearms and other hazards in the house should be identified and addressed as well.   ABILITY TO BE LEFT ALONE: If patient is unable to contact 911 operator, consider using LifeLine, or when the need is there, arrange for someone to stay with patients. Smoking is a fire hazard, consider supervision or cessation. Risk of wandering should be assessed by caregiver and if detected at any point, supervision and safe proof recommendations should be instituted.  MEDICATION SUPERVISION: Inability to self-administer medication needs to be constantly addressed. Implement a mechanism to ensure safe administration of the medications.      Mediterranean Diet A Mediterranean diet refers to food and lifestyle choices that are based on the traditions of countries located on the [employer] [place]. This way of eating has been shown to help prevent certain conditions and improve outcomes for people who have chronic diseases, like kidney disease and heart disease. What are tips for following this plan? Lifestyle  Cook and eat meals together with your family, when possible. Drink enough fluid to keep your urine clear or  pale yellow. Be physically active every day. This includes: Aerobic exercise like running or swimming. Leisure activities like gardening, walking, or housework. Get 7-8 hours of sleep each night. If recommended by your health care provider, drink red wine in moderation. This means 1 glass a day for nonpregnant women and 2 glasses a day for men. A glass of wine equals 5 oz (150 mL). Reading food labels  Check the serving size of packaged foods. For foods such as rice and pasta, the serving size refers to the amount of cooked product, not dry. Check the total fat in packaged foods. Avoid foods that have saturated fat or trans fats. Check the ingredients list for added sugars, such as corn syrup. Shopping  At the grocery store, buy most of your food from the areas near the walls of the store. This includes: Fresh fruits and vegetables (produce). Grains, beans, nuts, and seeds. Some of these may be available in unpackaged forms or large amounts (in bulk). Fresh seafood. Poultry and eggs. Low-fat dairy products. Buy whole ingredients instead of prepackaged foods. Buy fresh fruits and vegetables in-season from local farmers markets. Buy frozen fruits and vegetables in resealable bags. If you do not have access to quality fresh seafood, buy precooked frozen shrimp or canned fish, such as tuna, salmon, or sardines. Buy small amounts of raw or cooked vegetables, salads, or olives from the deli or salad bar at your store. Stock your pantry so you always  have certain foods on hand, such as olive oil, canned tuna, canned tomatoes, rice, pasta, and beans. Cooking  Cook foods with extra-virgin olive oil instead of using butter or other vegetable oils. Have meat as a side dish, and have vegetables or grains as your main dish. This means having meat in small portions or adding small amounts of meat to foods like pasta or stew. Use beans or vegetables instead of meat in common dishes like chili or  lasagna. Experiment with different cooking methods. Try roasting or broiling vegetables instead of steaming or sauteing them. Add frozen vegetables to soups, stews, pasta, or rice. Add nuts or seeds for added healthy fat at each meal. You can add these to yogurt, salads, or vegetable dishes. Marinate fish or vegetables using olive oil, lemon juice, garlic, and fresh herbs. Meal planning  Plan to eat 1 vegetarian meal one day each week. Try to work up to 2 vegetarian meals, if possible. Eat seafood 2 or more times a week. Have healthy snacks readily available, such as: Vegetable sticks with hummus. Greek yogurt. Fruit and nut trail mix. Eat balanced meals throughout the week. This includes: Fruit: 2-3 servings a day Vegetables: 4-5 servings a day Low-fat dairy: 2 servings a day Fish, poultry, or lean meat: 1 serving a day Beans and legumes: 2 or more servings a week Nuts and seeds: 1-2 servings a day Whole grains: 6-8 servings a day Extra-virgin olive oil: 3-4 servings a day Limit red meat and sweets to only a few servings a month What are my food choices? Mediterranean diet Recommended Grains: Whole-grain pasta. Brown rice. Bulgar wheat. Polenta. Couscous. Whole-wheat bread. [place] Made[place]. Vegetables: Artichokes. Beets. Broccoli. Cabbage. Carrots. Eggplant. Green beans. Chard. Kale. Spinach. Onions. Leeks. Peas. Squash. Tomatoes. Peppers. Radishes. Fruits: Apples. Apricots. Avocado. Berries. Bananas. Cherries. Dates. Figs. Grapes. Lemons. Melon. Oranges. Peaches. Plums. Pomegranate. Meats and other protein foods: Beans. Almonds. Sunflower seeds. Pine nuts. Peanuts. Cod. Salmon. Scallops. Shrimp. Tuna. Tilapia. Clams. Oysters. Eggs. Dairy: Low-fat milk. Cheese. Greek yogurt. Beverages: Water. Red wine. Herbal tea. Fats and oils: Extra virgin olive oil. Avocado oil. Grape seed oil. Sweets and desserts: [place] yogurt with honey. Baked apples. Poached pears. Trail mix. Seasoning and  other foods: Basil. Cilantro. Coriander. Cumin. Mint. Parsley. Sage. Rosemary. Tarragon. Garlic. Oregano. Thyme. Pepper. Balsalmic vinegar. Tahini. Hummus. Tomato sauce. Olives. Mushrooms. Limit these Grains: Prepackaged pasta or rice dishes. Prepackaged cereal with added sugar. Vegetables: Deep fried potatoes (french fries). Fruits: Fruit canned in syrup. Meats and other protein foods: Beef. Pork. Lamb. Poultry with skin. Hot dogs. [place]. Dairy: Ice cream. Sour cream. Whole milk. Beverages: Juice. Sugar-sweetened soft drinks. Beer. Liquor and spirits. Fats and oils: Butter. Canola oil. Vegetable oil. Beef fat (tallow). Lard. Sweets and desserts: Cookies. Cakes. Pies. Candy. Seasoning and other foods: Mayonnaise. Premade sauces and marinades. The items listed may not be a complete list. Talk with your dietitian about what dietary choices are right for you. Summary The Mediterranean diet includes both food and lifestyle choices. Eat a variety of fresh fruits and vegetables, beans, nuts, seeds, and whole grains. Limit the amount of red meat and sweets that you eat. Talk with your health care provider about whether it is safe for you to drink red wine in moderation. This means 1 glass a day for nonpregnant women and 2 glasses a day for men. A glass of wine equals 5 oz (150 mL). This information is not intended to replace advice given to you by your health  care provider. Make sure you discuss any questions you have with your health care provider. Document Released: 07/28/2016 Document Revised: 08/30/2016 Document Reviewed: 07/28/2016 Elsevier Interactive Patient Education  2017 [employer].

## 2024-10-16 ENCOUNTER — Ambulatory Visit: Payer: Self-pay | Admitting: Neurology

## 2024-10-16 LAB — VITAMIN B12: Vitamin B-12: 344 pg/mL (ref 200–1100)

## 2024-10-16 LAB — VITAMIN B1: Vitamin B1 (Thiamine): 8 nmol/L (ref 8–30)

## 2024-10-16 LAB — TSH: TSH: 2.08 m[IU]/L (ref 0.40–4.50)

## 2024-10-25 ENCOUNTER — Ambulatory Visit
Admission: RE | Admit: 2024-10-25 | Discharge: 2024-10-25 | Disposition: A | Discharge status: Home / Self Care | Source: Ambulatory Visit | Attending: Physician Assistant | Admitting: Physician Assistant

## 2024-10-25 MED ORDER — GADOPICLENOL 0.5 MMOL/ML IV SOLN
7.0000 mL | Freq: Once | INTRAVENOUS | Status: AC | PRN
  Administered 2024-10-25: 7 mL via INTRAVENOUS

## 2024-10-29 NOTE — Progress Notes (Signed)
 Patient advised.

## 2024-10-30 ENCOUNTER — Telehealth: Payer: Self-pay | Admitting: Physician Assistant

## 2024-10-30 NOTE — Telephone Encounter (Signed)
 Yes she needs neuropscy appt, the order is there. Can you look in to this and see why we can't schedule please.

## 2024-10-30 NOTE — Telephone Encounter (Signed)
 We have called the patient several times trying to get the Neuropsych testing sch. We have left several VM to have her call back. We called her again today and had to leave a VM

## 2024-10-30 NOTE — Telephone Encounter (Signed)
 Pt called in this morning, and she wants to know do she  have any more test that needs to be done. Thanks

## 2024-11-18 ENCOUNTER — Encounter: Payer: Self-pay | Admitting: Psychology

## 2024-11-18 DIAGNOSIS — M2010 Hallux valgus (acquired), unspecified foot: Secondary | ICD-10-CM | POA: Insufficient documentation

## 2024-11-18 DIAGNOSIS — M19079 Primary osteoarthritis, unspecified ankle and foot: Secondary | ICD-10-CM | POA: Insufficient documentation

## 2024-11-18 DIAGNOSIS — Q66229 Congenital metatarsus adductus, unspecified foot: Secondary | ICD-10-CM | POA: Insufficient documentation

## 2024-11-18 DIAGNOSIS — M2041 Other hammer toe(s) (acquired), right foot: Secondary | ICD-10-CM | POA: Insufficient documentation

## 2024-11-18 DIAGNOSIS — Q828 Other specified congenital malformations of skin: Secondary | ICD-10-CM | POA: Insufficient documentation

## 2024-11-18 DIAGNOSIS — I739 Peripheral vascular disease, unspecified: Secondary | ICD-10-CM | POA: Insufficient documentation

## 2024-11-18 DIAGNOSIS — M2042 Other hammer toe(s) (acquired), left foot: Secondary | ICD-10-CM | POA: Insufficient documentation

## 2024-11-18 DIAGNOSIS — M19071 Primary osteoarthritis, right ankle and foot: Secondary | ICD-10-CM | POA: Insufficient documentation

## 2024-11-22 ENCOUNTER — Ambulatory Visit: Payer: Self-pay

## 2024-11-22 ENCOUNTER — Encounter: Payer: Self-pay | Admitting: Psychology

## 2024-11-22 ENCOUNTER — Ambulatory Visit: Admitting: Psychology

## 2024-11-22 DIAGNOSIS — R4189 Other symptoms and signs involving cognitive functions and awareness: Secondary | ICD-10-CM

## 2024-11-22 DIAGNOSIS — F411 Generalized anxiety disorder: Secondary | ICD-10-CM

## 2024-11-22 DIAGNOSIS — G250 Essential tremor: Secondary | ICD-10-CM | POA: Insufficient documentation

## 2024-11-22 DIAGNOSIS — F330 Major depressive disorder, recurrent, mild: Secondary | ICD-10-CM | POA: Diagnosis not present

## 2024-11-22 NOTE — Progress Notes (Unsigned)
 NEUROPSYCHOLOGICAL EVALUATION [place] [place] Department of Neurology  Date of Evaluation: November 22, 2024  Reason for Referral:   Audrey Baxter is a 68 y.o. right-handed Caucasian female referred by Camie Sevin, PA-C, to characterize her current cognitive functioning and assist with diagnostic clarity and treatment planning in the context of subjective cognitive decline.   Assessment and Plan:   Clinical Impression(s): Audrey Baxter pattern of performance is suggestive of neuropsychological functioning within normal limits relative to age-matched peers. Performance was appropriate across all assessed cognitive domains. This includes processing speed, attention/concentration, executive functioning, receptive and expressive language, visuospatial abilities, and all aspects of learning and memory. Functionally, Audrey Baxter denied difficulties completing instrumental activities of daily living (ADLs) independently. She does not warrant consideration for a neurocognitive disorder at the present time.  Subjective day-to-day dysfunction is likely multifactorial in nature. Chiefly amongst concerns would be ongoing sleep dysfunction (she estimated obtaining an average of five hours of broken sleep per night), elevated alcohol consumption, and longstanding psychiatric distress with numerous associated medications. Especially when combined, this could certainly increase the potential for diminished processing speed or occasional attentional/memory lapses. These factors would be superimposed on the normal aging process.   Specific to memory, Audrey Baxter was able to learn novel verbal and visual information efficiently and retain this knowledge after lengthy delays. Overall, memory performance combined with intact performances across other areas of cognitive functioning is not suggestive of presently symptomatic Alzheimer's disease. Likewise, her cognitive and behavioral  profile is not suggestive of any other form of neurodegenerative illness presently.  Recommendations: Audrey Baxter should discuss management of sleep difficulties with her PCP or a sleep specialist.   The CDC defines heavy drinking in women as 8 or more drinks per week. Audrey Baxter reported consuming 2 glasses of wine nightly, which would suggest she is regularly exceeding this threshold. Chronic heavy drinking can have negative effects surrounding cognitive abilities, as well as overall physical health. She is encouraged to diminish alcohol consumption.   A combination of medication and psychotherapy has been shown to be most effective at treating symptoms of anxiety and depression. As such, Audrey Baxter is encouraged to continue working with her prescribing physician regarding mood-related medications. She could also consider engaging in short-term psychotherapy to address symptoms of psychiatric distress. She would benefit from an active and collaborative therapeutic environment, rather than one purely supportive in nature. Recommended treatment modalities include Cognitive Behavioral Therapy (CBT) or Acceptance and Commitment Therapy (ACT).  Audrey Baxter is encouraged to attend to lifestyle factors for brain health (e.g., regular physical exercise, good nutrition habits and consideration of the MIND-DASH diet, regular participation in cognitively-stimulating activities, and general stress management techniques), which are likely to have benefits for both emotional adjustment and cognition. In fact, in addition to promoting good general health, regular exercise incorporating aerobic activities (e.g., brisk walking, jogging, cycling, etc.) has been demonstrated to be a very effective treatment for depression and stress, with similar efficacy rates to both antidepressant medication and psychotherapy. Optimal control of vascular risk factors (including safe cardiovascular exercise and adherence to dietary  recommendations) is encouraged. Continued participation in activities which provide mental stimulation and social interaction is also recommended.   If interested, there are some activities which have therapeutic value and can be useful in keeping her cognitively stimulated. For suggestions, Audrey Baxter is encouraged to go to the following website: https://www.[facility].org/get-to-know-barrow/centers-programs/neurorehabilitation-center/neuro-rehab-apps-and-games/ which has smart phone/tablet based options. It should be noted that these activities should  not be viewed as a substitute for therapy.  Memory can be improved using internal strategies such as rehearsal, repetition, chunking, mnemonics, association, and imagery. External strategies such as written notes in a consistently used memory journal, visual and nonverbal auditory cues such as a calendar on the refrigerator or appointments with alarm, such as on a cell phone, can also help maximize recall.    Review of Records:   Past Medical History:  Diagnosis Date   Acquired hallux valgus    Acquired hammer toe of left foot    Acquired hammer toe of right foot    Anxiety    Arthritis of right midfoot    Closed fracture of right distal radius 08/19/2017   Essential tremor    GERD (gastroesophageal reflux disease)    Heart murmur    Hip pain, bilateral 11/16/2011   Right is worse than the left and on the right the SI joint has poor mobility     History of cancer    cervical/melanoma   History of colon polyps    History of melanoma in situ 12/24/2013   Followed by Dermatology Dr. Bard Molt [place]     HTN (hypertension) 11/24/2016   Hyperlipidemia    Insomnia    Leg length inequality 11/16/2011   Right is significantly shorter but she has learned to compensate for this by changing her pelvic rotation and her trunk position     Major depressive disorder 11/24/2016   Metatarsus adductus    Osteoporosis    Peripheral vascular  disease    Porokeratosis    Primary localized osteoarthrosis of ankle and foot    Raynaud's disease 10/19/2018   Right knee pain 08/06/2013   Right rotator cuff tear 02/16/2024   RLS (restless legs syndrome) 10/24/2017   Rotator cuff arthropathy of both shoulders 03/28/2024   Bilateral injections March 28, 2024     Sensorineural hearing loss of left ear 04/2015    Past Surgical History:  Procedure Laterality Date   BREAST CYST EXCISION Left    BREAST SURGERY     Left lumpectomy   BUNIONECTOMY Bilateral    CARDIAC CATHETERIZATION     15 years ago   OPEN REDUCTION INTERNAL FIXATION (ORIF) DISTAL RADIAL FRACTURE Right 08/19/2017   Procedure: OPEN REDUCTION INTERNAL FIXATION (ORIF) DISTAL RADIAL FRACTURE WITH REPAIR RECONSTRUCTION, ALLOGRAFT BONE GRAFT AS NECESSARY;  Surgeon: Camella Fallow, MD;  Location: [facility] OR;  Service: Orthopedics;  Laterality: Right;  90 MINS   WRIST ARTHROPLASTY  2007    Current Outpatient Medications:    amLODipine  (NORVASC ) 10 MG tablet, Take 1 tablet (10 mg total) by mouth daily., Disp: 30 tablet, Rfl: 0   ARIPiprazole (ABILIFY) 10 MG tablet, Take 5 mg by mouth every evening. , Disp: , Rfl:    buPROPion (WELLBUTRIN XL) 300 MG 24 hr tablet, Take 300 mg by mouth daily., Disp: , Rfl:    CALCIUM -VITAMIN D  PO, Take 1 tablet by mouth 2 (two) times daily., Disp: , Rfl:    cyanocobalamin 1000 MCG tablet, Take 1,000 mcg by mouth daily., Disp: , Rfl:    desvenlafaxine (PRISTIQ) 100 MG 24 hr tablet, Take 100 mg by mouth daily., Disp: , Rfl:    estradiol  (ESTRING ) 2 MG vaginal ring, Place 2 mg vaginally every 3 (three) months. follow package directions, Disp: 1 each, Rfl: 3   hydrocortisone cream 0.5 %, Apply 1 application topically daily as needed for itching. , Disp: , Rfl:    ibuprofen (ADVIL,MOTRIN) 200  MG tablet, Take 400 mg by mouth 3 (three) times daily as needed for mild pain., Disp: , Rfl:    lisinopril -hydrochlorothiazide  (PRINZIDE ,ZESTORETIC ) 10-12.5 MG tablet,  Take 1 tablet by mouth daily., Disp: 90 tablet, Rfl: 2   loratadine (CLARITIN) 10 MG tablet, Take 10 mg by mouth daily., Disp: , Rfl:    nitroGLYCERIN  (NITRO-DUR ) 0.2 mg/hr patch, Apply 1/4 of a patch to skin once daily., Disp: 30 patch, Rfl: 0   Omega-3 Fatty Acids (FISH OIL) 1000 MG CAPS, Take 1,000 mg by mouth daily., Disp: , Rfl:    pantoprazole  (PROTONIX ) 40 MG tablet, TAKE 1 TABLET BY MOUTH ONCE DAILY (Patient not taking: Reported on 12/25/2019), Disp: 90 tablet, Rfl: 1   primidone  (MYSOLINE ) 50 MG tablet, Take 2 tablets (100 mg total) by mouth 2 (two) times daily., Disp: 120 tablet, Rfl: 0   rosuvastatin  (CRESTOR ) 10 MG tablet, Take 1 tablet (10 mg total) by mouth daily., Disp: 90 tablet, Rfl: 1   ROYAL JELLY PO, Take 1 capsule by mouth daily as needed (energy)., Disp: , Rfl:    typhoid (VIVOTIF) DR capsule, Take 1 capsule by mouth every other day. (Patient not taking: Reported on 12/25/2019), Disp: 4 capsule, Rfl: 0   zolpidem (AMBIEN) 10 MG tablet, Take 10 mg by mouth at bedtime as needed for sleep. , Disp: , Rfl:      10/11/2024   10:00 AM 12/23/2016    1:47 PM  Montreal Cognitive Assessment   Visuospatial/ Executive (0/5) 5 5  Naming (0/3) 3 3  Attention: Read list of digits (0/2) 2 2  Attention: Read list of letters (0/1) 1 1  Attention: Serial 7 subtraction starting at 100 (0/3) 3 3  Language: Repeat phrase (0/2) 2 2  Language : Fluency (0/1) 1 1  Abstraction (0/2) 2 2  Delayed Recall (0/5) 5 3  Orientation (0/6) 6 5  Total 30 27  Adjusted Score (based on education) 30 27   Neuroimaging: Brain MRI on 10/25/2024 was unremarkable.  Clinical Interview:   The following information was obtained during a clinical interview with Audrey Baxter prior to cognitive testing.  Cognitive Symptoms: Decreased short-term memory: Endorsed. Her primary concern surrounded trouble recalling What I did yesterday or What I ate for dinner the night before. She noted that, with intentional focus,  she is often able to recall some details. She also noted broader difficulties recalling names and details of recent conversations. Difficulties were said to have been observable for the past six months and have remained stable over that time.  Decreased long-term memory: Denied. Decreased attention/concentration: Endorsed. She described increased ease of distractibility. She noted her husband's perception that memory difficulties are actually grounded in attentional dysregulation. Longstanding attentional difficulties dating back to early childhood and adolescence were denied.  Reduced processing speed: Denied. Difficulties with executive functions: Endorsed. She reported a primary deficit surrounding multi-tasking. She also noted increased trouble sequencing items or tasks. No trouble with impulsivity was reported. She also denied any personality changes.  Difficulties with emotion regulation: Denied. Difficulties with receptive language: Denied. Difficulties with word finding: Denied. Decreased visuoperceptual ability: Denied.  Difficulties completing ADLs: Denied.  Additional Medical History: History of traumatic brain injury/concussion: Denied. History of stroke: Denied. History of seizure activity: Denied. History of known exposure to toxins: Denied. Symptoms of chronic pain: She reported arthritis-based pain surrounding her back and shoulders. Pain levels were described as more manageable annoyances rather than debilitating experiences.  Experience of frequent headaches/migraines: Denied. Frequent instances of  dizziness/vertigo: Denied.  Sensory changes: She utilizes glasses and hearing aids with benefit. Other sensory changes/difficulties (e.g., taste or smell) were denied.  Balance/coordination difficulties: She described occasional balance instability, noting a tendency to lean towards the left. This is not present all the time. No prior falls were reported.  Other motor difficulties:  She has a history of essential tremor with symptoms impacting her hands bilaterally. Tremors are worsened by caffeine and when perform public actions (e.g., ministerial duties in front of others). Symptoms are managed well via medication.   Sleep History: Estimated hours obtained each night: 5 hours.  Difficulties falling asleep: Endorsed. Difficulties staying asleep: Endorsed. This represented her primary sleep-based issue as she described her sleep as broken in nature.  Feels rested and refreshed upon awakening: While she reported commonly waking up feeling groggy, she noted that she generally feels fine shortly after waking and moving around a bit.   History of snoring: Denied. History of waking up gasping for air: Denied. Witnessed breath cessation while asleep: Denied.  History of vivid dreaming: Denied. Excessive movement while asleep: Denied. Instances of acting out her dreams: Denied.  Psychiatric/Behavioral Health History: Depression: She acknowledged a longstanding history of depressive symptoms, noting that she is currently taking three anti-depressant medications. She described her current mood as good, noting that medication efforts have been beneficial thus far. She denied current or remote suicidal ideation, intent, or plan.  Anxiety: She acknowledged generalized anxious distress with symptoms generally being present during the past year or two and seem to surround the current political climate.  Mania: Denied. Trauma History: Denied. Visual/auditory hallucinations: Denied. Delusional thoughts: Denied.  Tobacco: Denied. Alcohol: She reported consuming two glasses of wine nightly and denied a personal history of alcohol abuse or dependence.  Recreational drugs: Denied.  Family History: Problem Relation Age of Onset   COPD Mother    Cancer Father        lung, bladder   Alcohol abuse Father    HIV/AIDS Brother    Cancer Brother        lung   Stroke Maternal  Grandmother    Dementia Maternal Grandmother    Heart attack Maternal Grandfather    Cancer Paternal Grandfather        lung/spine   Breast cancer Maternal Aunt 50   Alcohol abuse Paternal Aunt    This information was confirmed by Audrey Baxter.  Academic/Vocational History: Highest level of educational attainment: 18 years. She earned separate Master's degrees in public administration and divinity. She described herself as a strong (mostly A) student in academic settings. No relative weaknesses were identified.  History of developmental delay: Denied. History of grade repetition: Denied. Enrollment in special education courses: Denied. History of LD/ADHD: Denied.  Employment: Retired. She does continue to perform some ministerial work.   Evaluation Results:   Behavioral Observations: Audrey Baxter was unaccompanied, arrived to her appointment on time, and was appropriately dressed and groomed. She appeared alert. Observed gait and station were within normal limits. Subtle tremors were exhibited in her hands bilaterally during interview. Her affect was generally relaxed and positive. There was some suspicion of underlying test anxiety. Spontaneous speech was fluent and word finding difficulties were not observed during the clinical interview. Thought processes were coherent, organized, and normal in content. Insight into her cognitive difficulties appeared adequate.   During testing, a mild tremor was exhibited across motorized tasks (e.g., TMT A and B). Sustained attention was appropriate. Task engagement was adequate and she persisted when  challenged. Overall, Audrey Baxter was cooperative with the clinical interview and subsequent testing procedures.   Adequacy of Effort: The validity of neuropsychological testing is limited by the extent to which the individual being tested may be assumed to have exerted adequate effort during testing. Audrey Baxter expressed her intention to perform to the  best of her abilities and exhibited adequate task engagement and persistence. Scores across stand-alone performance validity measures were within expectation. As such, the results of the current evaluation are believed to be a valid representation of Audrey Baxter's current cognitive functioning.  Test Results: Audrey Baxter was oriented at the time of the current evaluation.  Intellectual abilities based upon educational and vocational attainment were estimated to be in the average to above average range. Premorbid abilities were estimated to be within the exceptionally high range based upon a single-word reading test.   Processing speed was below average to average. Basic attention was above average. More complex attention (e.g., working memory) was also above average. Executive functioning was average to exceptionally high.  While not directly assessed, receptive language abilities were believed to be intact. Audrey Baxter did not exhibit any difficulties comprehending task instructions and answered all questions asked of her appropriately. Assessed expressive language (e.g., verbal fluency and confrontation naming) was average to well above average.     Assessed visuospatial/visuoconstructional abilities were mildly variable but overall appropriate, ranging from the below average to exceptionally high normative ranges.    Learning (i.e., encoding) of novel verbal and visual information was average to well above average. Spontaneous delayed recall (i.e., retrieval) of previously learned information was average to above average. Retention rates were 90% across a list learning task, 97% across a story learning task, 88% across a daily living task, and 88% across a shape learning task. Performance across recognition tasks was appropriate, suggesting evidence for information consolidation.   Results of emotional screening instruments suggested that recent symptoms of generalized anxiety were in the minimal  range, while symptoms of depression were within normal limits. A screening instrument assessing recent sleep quality suggested the presence of moderate sleep dysfunction.  Table of Scores:   Note: This summary of test scores accompanies the interpretive report and should not be considered in isolation without reference to the appropriate sections in the text. Descriptors are based on appropriate normative data and may be adjusted based on clinical judgment. Terms such as Within Normal Limits and Outside Normal Limits are used when a more specific description of the test score cannot be determined.       Percentile - Normative Descriptor > 98 - Exceptionally High 91-97 - Well Above Average 75-90 - Above Average 25-74 - Average 9-24 - Below Average 2-8 - Well Below Average < 2 - Exceptionally Low       Validity:   DESCRIPTOR       ACS WC: --- --- Within Normal Limits  DCT: --- --- Within Normal Limits       Orientation:      Raw Score Percentile   NAB Orientation, Form 1 28/29 --- ---       Cognitive Screening:      Raw Score Percentile   SLUMS: 30/30 --- ---       Intellectual Functioning:      Standard Score Percentile   Test of Premorbid Functioning: 131 98 Exceptionally High       Memory:     NAB Memory Module, Form 1: Standard Score/ T Score Percentile   Total Memory Index  114 82 Above Average  List Learning       Total Trials 1-3 30/36 (63) 91 Well Above Average    List B 8/12 (64) 92 Well Above Average    Short Delay Free Recall 10/12 (60) 84 Above Average    Long Delay Free Recall 9/12 (54) 66 Average    Retention Percentage 90 (48) 42 Average    Recognition Discriminability 9 (54) 66 Average  Shape Learning       Total Trials 1-3 20/27 (60) 84 Above Average    Delayed Recall 7/9 (58) 79 Above Average    Retention Percentage 88 (47) 38 Average    Recognition Discriminability 5 (42) 21 Below Average  Story Learning       Immediate Recall 66/80 (50) 50 Average     Delayed Recall 38/40 (60) 84 Above Average    Retention Percentage 97 (54) 66 Average  Daily Living Memory       Immediate Recall 46/51 (54) 66 Average    Delayed Recall 15/17 (51) 54 Average    Retention Percentage 88 (51) 54 Average    Recognition Hits 10/10 (60) 84 Above Average       Attention/Executive Function:     Trail Making Test (TMT): Raw Score (T Score) Percentile     Part A 40 secs.,  0 errors (40) 16 Below Average    Part B 61 secs.,  0 errors (54) 66 Average         Scaled Score Percentile   WAIS-5 Coding: 10 50 Average  WAIS-5 Naming Speed Quantity: 13 84 Above Average        Scaled Score Percentile   WAIS-5 Digits Forwards: 12 75 Above Average  WAIS-5 Digit Sequencing: 12 75 Above Average        Scaled Score Percentile   WAIS-5 Similarities: 19 >99 Exceptionally High  WAIS-5 Matrix Reasoning: 9 37 Average  WAIS-5 Figure Weights: 13 84 Above Average       D-KEFS Verbal Fluency Test: Raw Score (Scaled Score) Percentile     Letter Total Correct 45 (13) 84 Above Average    Category Total Correct 44 (14) 91 Well Above Average    Category Switching Total Correct 18 (17) 99 Exceptionally High    Category Switching Accuracy 17 (16) 98 Exceptionally High      Total Set Loss Errors 1 (11) 63 Average      Total Repetition Errors 2 (11) 63 Average       Language:     Verbal Fluency Test: Raw Score (T Score) Percentile     Phonemic Fluency (FAS) 45 (48) 42 Average    Animal Fluency 21 (50) 50 Average        NAB Language Module, Form 1: T Score Percentile     Naming 31/31 (56) 73 Average       Visuospatial/Visuoconstruction:      Raw Score Percentile   Clock Drawing: 10/10 --- Within Normal Limits       NAB Spatial Module, Form 1: T Score Percentile     Figure Drawing Copy 71 98 Exceptionally High        Scaled Score Percentile   WAIS-5 Visual Puzzles: 7 16 Below Average       Mood and Personality:      Raw Score Percentile   Beck Depression Inventory  - II: 13 --- Within Normal Limits  PROMIS Anxiety Questionnaire: 13 --- None to Slight       Additional Questionnaires:  Raw Score Percentile   PROMIS Sleep Disturbance Questionnaire: 35 --- Moderate   Informed Consent and Coding/Compliance:   The current evaluation represents a clinical evaluation for the purposes previously outlined by the referral source and is in no way reflective of a forensic evaluation.   Ms. Nouri was provided with a verbal description of the nature and purpose of the present neuropsychological evaluation. Also reviewed were the foreseeable risks and/or discomforts and benefits of the procedure, limits of confidentiality, and mandatory reporting requirements of this provider. The patient was given the opportunity to ask questions and receive answers about the evaluation. Oral consent to participate was provided by the patient.   This evaluation was conducted by Arthea KYM Maryland, Ph.D., ABPP-CN, board certified clinical neuropsychologist. Ms. Delprado completed a clinical interview with Dr. Maryland, billed as one unit [phone], and 158 minutes of cognitive testing and scoring, billed as one unit [phone] and four additional units [id number]. Psychometrist Evalene Pizza, B.S. assisted Dr. Maryland with test administration and scoring procedures. As a separate and discrete service, one unit [phone] and two units 96133 (153 minutes) were billed for Dr. Loralee time spent in interpretation and report writing.

## 2024-11-22 NOTE — Progress Notes (Signed)
   Psychometrician Note   Cognitive testing was administered to Audrey Baxter by Evalene Pizza, B.S. (psychometrist) under the supervision of Dr. Arthea KYM Maryland, Ph.D., ABPP, licensed psychologist on 11/22/2024. Audrey Baxter did not appear overtly distressed by the testing session per behavioral observation or responses across self-report questionnaires. Rest breaks were offered.   The battery of tests administered was selected by Dr. Zachary C. Merz, Ph.D., ABPP with consideration to Audrey Baxter's current level of functioning, the nature of her symptoms, emotional and behavioral responses during interview, level of literacy, observed level of motivation/effort, and the nature of the referral question. This battery was communicated to the psychometrist. Communication between Dr. Arthea KYM Maryland, Ph.D., ABPP and the psychometrist was ongoing throughout the evaluation and Dr. Arthea KYM Maryland, Ph.D., ABPP was immediately accessible at all times. Dr. Zachary C. Merz, Ph.D., ABPP provided supervision to the psychometrist on the date of this service to the extent necessary to assure the quality of all services provided.    Audrey Baxter will return within approximately 1-2 weeks for an interactive feedback session with Dr. Maryland at which time her test performances, clinical impressions, and treatment recommendations will be reviewed in detail. Audrey Baxter understands she can contact our office should she require our assistance before this time.  A total of 158 minutes of billable time were spent face-to-face with Audrey Baxter by the psychometrist. This includes both test administration and scoring time. Billing for these services is reflected in the clinical report generated by Dr. Arthea KYM Maryland, Ph.D., ABPP  This note reflects time spent with the psychometrician and does not include test scores or any clinical interpretations made by Dr. Maryland. The full report will follow in a separate note.

## 2024-11-29 ENCOUNTER — Ambulatory Visit: Payer: Self-pay | Admitting: Psychology

## 2024-11-29 DIAGNOSIS — R4189 Other symptoms and signs involving cognitive functions and awareness: Secondary | ICD-10-CM

## 2024-11-29 NOTE — Progress Notes (Signed)
° °  Neuropsychology Feedback Session Audrey Baxter  Reason for Referral:   Audrey Baxter is a 68 y.o. right-handed Caucasian female referred by Camie Sevin, PA-C, to characterize her current cognitive functioning and assist with diagnostic clarity and treatment planning in the context of subjective cognitive decline.   Feedback:   Audrey Baxter completed a comprehensive neuropsychological evaluation on 11/22/2024. Please refer to that encounter for the full report and recommendations. Briefly, results suggested neuropsychological functioning within normal limits relative to age-matched peers. Performance was appropriate across all assessed cognitive domains. This includes processing speed, attention/concentration, executive functioning, receptive and expressive language, visuospatial abilities, and all aspects of learning and memory. Subjective day-to-day dysfunction is likely multifactorial in nature. Chiefly amongst concerns would be ongoing sleep dysfunction (she estimated obtaining an average of five hours of broken sleep per night), elevated alcohol consumption, and longstanding psychiatric distress with numerous associated medications. Especially when combined, this could certainly increase the potential for diminished processing speed or occasional attentional/memory lapses. These factors would be superimposed on the normal aging process.   Audrey Baxter was accompanied by her husband during the current feedback session. Content of the current session focused on the results of her neuropsychological evaluation. Audrey Baxter was given the opportunity to ask questions and her questions were answered. She was encouraged to reach out should additional questions arise. A copy of her report was provided at the conclusion of the visit.      One unit 96132 (31 minutes) was billed for Dr. Loralee time spent preparing for, conducting, and documenting the  current feedback session with Audrey Baxter.

## 2025-01-29 ENCOUNTER — Other Ambulatory Visit

## 2025-02-25 ENCOUNTER — Other Ambulatory Visit (HOSPITAL_BASED_OUTPATIENT_CLINIC_OR_DEPARTMENT_OTHER)
